# Patient Record
Sex: Female | Born: 1955 | Race: Black or African American | Hispanic: No | Marital: Married | Smoking: Never smoker
Health system: Southern US, Community
[De-identification: ages and names within clinical notes are randomized; demographics above are authoritative.]

## PROBLEM LIST (undated history)

## (undated) DIAGNOSIS — F419 Anxiety disorder, unspecified: Secondary | ICD-10-CM

## (undated) DIAGNOSIS — F329 Major depressive disorder, single episode, unspecified: Secondary | ICD-10-CM

## (undated) DIAGNOSIS — F0390 Unspecified dementia without behavioral disturbance: Secondary | ICD-10-CM

## (undated) DIAGNOSIS — F32A Depression, unspecified: Secondary | ICD-10-CM

## (undated) DIAGNOSIS — S42309A Unspecified fracture of shaft of humerus, unspecified arm, initial encounter for closed fracture: Secondary | ICD-10-CM

## (undated) HISTORY — DX: Anxiety disorder, unspecified: F41.9

## (undated) HISTORY — DX: Depression, unspecified: F32.A

## (undated) HISTORY — PX: OTHER SURGICAL HISTORY: SHX169

## (undated) HISTORY — DX: Unspecified dementia, unspecified severity, without behavioral disturbance, psychotic disturbance, mood disturbance, and anxiety: F03.90

## (undated) HISTORY — PX: BACK SURGERY: SHX140

## (undated) HISTORY — DX: Major depressive disorder, single episode, unspecified: F32.9

## (undated) HISTORY — PX: BREAST ENHANCEMENT SURGERY: SHX7

## (undated) HISTORY — DX: Unspecified fracture of shaft of humerus, unspecified arm, initial encounter for closed fracture: S42.309A

---

## 2012-12-20 ENCOUNTER — Encounter: Payer: Self-pay | Admitting: General Practice

## 2012-12-20 ENCOUNTER — Ambulatory Visit (INDEPENDENT_AMBULATORY_CARE_PROVIDER_SITE_OTHER): Payer: BC Managed Care – PPO | Admitting: General Practice

## 2012-12-20 VITALS — BP 131/78 | HR 57 | Temp 97.4°F | Wt 138.5 lb

## 2012-12-20 DIAGNOSIS — R21 Rash and other nonspecific skin eruption: Secondary | ICD-10-CM

## 2012-12-20 DIAGNOSIS — Z23 Encounter for immunization: Secondary | ICD-10-CM

## 2012-12-20 DIAGNOSIS — B360 Pityriasis versicolor: Secondary | ICD-10-CM

## 2012-12-20 LAB — POCT SKIN KOH: Skin KOH, POC: NEGATIVE

## 2012-12-20 MED ORDER — CLOTRIMAZOLE-BETAMETHASONE 1-0.05 % EX CREA: 1.0000 "application " | TOPICAL_CREAM | Freq: Two times a day (BID) | CUTANEOUS | Status: DC

## 2012-12-20 NOTE — Patient Instructions (Signed)
Tinea Versicolor  Tinea versicolor is a common yeast infection of the skin. This condition becomes known when the yeast on our skin starts to overgrow (yeast is a normal inhabitant on our skin). This condition is noticed as white or light brown patches on brown skin, and is more evident in the summer on tanned skin. These areas are slightly scaly if scratched. The light patches from the yeast become evident when the yeast creates "holes in your suntan". This is most often noticed in the summer. The patches are usually located on the chest, back, pubis, neck and body folds. However, it may occur on any area of body. Mild itching and inflammation (redness or soreness) may be present.  DIAGNOSIS   The diagnosisof this is made clinically (by looking). Cultures from samples are usually not needed. Examination under the microscope may help. However, yeast is normally found on skin. The diagnosis still remains clinical. Examination under Wood's Ultraviolet Light can determine the extent of the infection.  TREATMENT   This common infection is usually only of cosmetic (only a concern to your appearance). It is easily treated with dandruff shampoo used during showers or bathing. Vigorous scrubbing will eliminate the yeast over several days time. The light areas in your skin may remain for weeks or months after the infection is cured unless your skin is exposed to sunlight. The lighter or darker spots caused by the fungus that remain after complete treatment are not a sign of treatment failure; it will take a long time to resolve. Your caregiver may recommend a number of commercial preparations or medication by mouth if home care is not working. Recurrence is common and preventative medication may be necessary.  This skin condition is not highly contagious. Special care is not needed to protect close friends and family members. Normal hygiene is usually enough. Follow up is required only if you develop complications (such as a  secondary infection from scratching), if recommended by your caregiver, or if no relief is obtained from the preparations used.  Document Released: 01/03/2000 Document Revised: 03/30/2011 Document Reviewed: 02/15/2008  ExitCare Patient Information 2014 [employer].

## 2012-12-20 NOTE — Progress Notes (Signed)
   Subjective:    Patient ID: Jasmine [name], female    DOB: 23-Dec-1955, 57 y.o.   MRN: [id number]  Rash This is a new problem. The current episode started in the past 7 days. The problem is unchanged. The affected locations include the face. The rash is characterized by itchiness, dryness, redness and scaling. She was exposed to nothing. Pertinent negatives include no congestion, cough, facial edema, fatigue, rhinorrhea, shortness of breath or sore throat. Past treatments include anti-itch cream. The treatment provided no relief. There is no history of allergies, asthma or eczema.      Review of Systems  Constitutional: Negative for fatigue.  HENT: Negative for congestion, rhinorrhea and sore throat.   Respiratory: Negative for cough, chest tightness and shortness of breath.   Cardiovascular: Negative for chest pain and palpitations.  Skin: Positive for rash.       Red itchy rash to left upper lip and lower right chin area  Neurological: Negative for dizziness, weakness and headaches.       Objective:   Physical Exam  Constitutional: She is oriented to person, place, and time. She appears well-developed and well-nourished.  Pulmonary/Chest: Effort normal and breath sounds normal. No respiratory distress. She exhibits no tenderness.  Neurological: She is alert and oriented to person, place, and time.  Skin: Skin is warm and dry. Rash noted.  Maculopapular erythematous, scaly round rash noted to left upper lip and right chin area. Negative drainage.  Psychiatric: She has a normal mood and affect.     Results for orders placed in visit on 12/20/12  POCT SKIN KOH      Result Value Range   Skin KOH, POC Negative          Assessment & Plan:  1. Rash and nonspecific skin eruption  - POCT Skin KOH  2. Tinea versicolor  - clotrimazole-betamethasone (LOTRISONE) cream; Apply 1 application topically 2 (two) times daily.  Dispense: 30 g; Refill: 0 -keep skin clean and dry -refrain  from wearing make up until resolved -will refer to dermatology if symptoms worsen or unresolved -Patient verbalized understanding Coralie Keens, FNP-C

## 2013-01-02 ENCOUNTER — Encounter: Payer: Self-pay | Admitting: General Practice

## 2013-01-02 ENCOUNTER — Ambulatory Visit (INDEPENDENT_AMBULATORY_CARE_PROVIDER_SITE_OTHER): Payer: BC Managed Care – PPO | Admitting: General Practice

## 2013-01-02 VITALS — BP 118/69 | HR 79 | Temp 98.3°F | Ht 64.0 in | Wt 132.0 lb

## 2013-01-02 DIAGNOSIS — Z1239 Encounter for other screening for malignant neoplasm of breast: Secondary | ICD-10-CM

## 2013-01-02 DIAGNOSIS — Z01419 Encounter for gynecological examination (general) (routine) without abnormal findings: Secondary | ICD-10-CM

## 2013-01-02 DIAGNOSIS — Z0000 Encounter for general adult medical examination without abnormal findings: Secondary | ICD-10-CM

## 2013-01-02 DIAGNOSIS — B360 Pityriasis versicolor: Secondary | ICD-10-CM

## 2013-01-02 DIAGNOSIS — Z124 Encounter for screening for malignant neoplasm of cervix: Secondary | ICD-10-CM

## 2013-01-02 LAB — POCT CBC
Granulocyte percent: 68.9 %G (ref 37–80)
HCT, POC: 41.3 % (ref 37.7–47.9)
Hemoglobin: 13.4 g/dL (ref 12.2–16.2)
Lymph, poc: 1.5 (ref 0.6–3.4)
MCH, POC: 29.7 pg (ref 27–31.2)
MCHC: 32.5 g/dL (ref 31.8–35.4)
MCV: 91.4 fL (ref 80–97)
MPV: 8.6 fL (ref 0–99.8)
POC Granulocyte: 3.9 (ref 2–6.9)
POC LYMPH PERCENT: 25.9 %L (ref 10–50)
Platelet Count, POC: 215 10*3/uL (ref 142–424)
RBC: 4.5 M/uL (ref 4.04–5.48)
RDW, POC: 13.7 %
WBC: 5.7 10*3/uL (ref 4.6–10.2)

## 2013-01-02 NOTE — Patient Instructions (Signed)
Pap Test A Pap test checks the cells on the surface of your cervix. Your doctor will look for cell changes that are not normal, an infection, or cancer. If the cells no longer look normal, it is called dysplasia. Dysplasia can turn into cancer. Regular Pap tests are important to stop cancer from developing. BEFORE THE PROCEDURE  Ask your doctor when to schedule your Pap test. Timing the test around your period may be important.  Do not douche or have sex (intercourse) for 24 hours before the test.  Do not put creams on your vagina or use tampons for 24 hours before the test.  Go pee (urinate) just before the test. PROCEDURE  You will lie on an exam table with your feet in stirrups.  A warm metal or plastic tool (speculum) will be put in your vagina to open it up.  Your doctor will use a small, plastic brush or wooden spatula to take cells from your cervix.  The cells will be put in a lab container.  The cells will be checked under a microscope to see if they are normal or not. AFTER THE PROCEDURE Get your test results. If they are abnormal, you may need more tests. Document Released: 02/07/2010 Document Revised: 03/30/2011 Document Reviewed: 01/01/2011 Exit[employer] Patient Information 2014 [employer].  

## 2013-01-02 NOTE — Progress Notes (Signed)
   Subjective:    Patient ID: Jasmine [name], female    DOB: May 26, 1955, 57 y.o.   MRN: [id number]  HPI Patient presents today for annual exam. Reports eating a healthy diet and denies regular exercise.  Reports relocating from [place] to [place], [place] 4 months ago. Reports a injury 14 years ago that resulted in tailbone fracture and chronic pain from injury. She reports discontinuing OxyContin on her own, due to noticed memory loss. Denies any other chronic conditions.     Review of Systems  Constitutional: Negative for fever and chills.  Respiratory: Negative for chest tightness and shortness of breath.   Cardiovascular: Negative for chest pain and palpitations.  Gastrointestinal: Negative for nausea, vomiting, abdominal pain, diarrhea, constipation and blood in stool.  Genitourinary: Negative for dysuria and difficulty urinating.  Musculoskeletal: Positive for back pain.       Back pain from injury 14 years ago  Neurological: Negative for dizziness, weakness and headaches.  All other systems reviewed and are negative.        Objective:   Physical Exam  Constitutional: She is oriented to person, place, and time. She appears well-developed and well-nourished.  HENT:  Head: Normocephalic and atraumatic.  Right Ear: External ear normal.  Left Ear: External ear normal.  Mouth/Throat: Oropharynx is clear and moist.  Eyes: Conjunctivae and EOM are normal. Pupils are equal, round, and reactive to light.  Neck: Normal range of motion. Neck supple. No thyromegaly present.  Cardiovascular: Normal rate, regular rhythm, normal heart sounds and intact distal pulses.   Pulmonary/Chest: Effort normal and breath sounds normal. No respiratory distress. Right breast exhibits no inverted nipple, no nipple discharge, no skin change and no tenderness. Left breast exhibits no inverted nipple, no nipple discharge, no skin change and no tenderness. Breasts are asymmetrical.  Patient has breast implants and  breast are asymmetrical.   Abdominal: Soft. Bowel sounds are normal. She exhibits no distension.  Genitourinary: Vagina normal and uterus normal. No breast swelling, tenderness, discharge or bleeding. No labial fusion. There is no rash, tenderness, lesion or injury on the right labia. There is no rash, tenderness, lesion or injury on the left labia. Uterus is not deviated, not enlarged, not fixed and not tender. Cervix exhibits no motion tenderness, no discharge and no friability. Right adnexum displays no mass, no tenderness and no fullness. Left adnexum displays no mass, no tenderness and no fullness. No erythema, tenderness or bleeding around the vagina. No foreign body around the vagina. No signs of injury around the vagina. No vaginal discharge found.  Lymphadenopathy:    She has no cervical adenopathy.  Neurological: She is alert and oriented to person, place, and time.  Skin: Skin is warm and dry.  Psychiatric: She has a normal mood and affect.          Assessment & Plan:  1. Annual physical exam  - POCT CBC - CMP14+EGFR - NMR, lipoprofile - Pap IG w/ reflex to HPV when ASC-U  2. Breast cancer screening  - MM Digital Diagnostic Bilat; Future (appointment scheduled for Jan. 07, 2015 at 11am, [facility]) -discussed healthy eating and regular exercise -RTO prn Patient verbalized understanding Coralie Keens, FNP-C

## 2013-01-03 MED ORDER — CLOTRIMAZOLE-BETAMETHASONE 1-0.05 % EX CREA: 1.0000 "application " | TOPICAL_CREAM | Freq: Two times a day (BID) | CUTANEOUS | Status: AC

## 2013-01-04 LAB — NMR, LIPOPROFILE
Cholesterol: 195 mg/dL (ref <200)
HDL Cholesterol by NMR: 77 mg/dL (ref >=40)
HDL Particle Number: 35.9 umol/L (ref >=30.5)
LDL Particle Number: 1118 nmol/L — ABNORMAL HIGH (ref <1000)
LDL Size: 21.7 nm (ref >20.5)
LDLC SERPL CALC-MCNC: 103 mg/dL — ABNORMAL HIGH (ref <100)
LP-IR Score: <25 (ref <=45)
Small LDL Particle Number: <90 nmol/L (ref <=527)
Triglycerides by NMR: 76 mg/dL (ref <150)

## 2013-01-04 LAB — CMP14+EGFR
ALT: 11 IU/L (ref 0–32)
AST: 30 IU/L (ref 0–40)
Albumin/Globulin Ratio: 2.1 (ref 1.1–2.5)
Albumin: 5 g/dL (ref 3.5–5.5)
Alkaline Phosphatase: 73 IU/L (ref 39–117)
BUN/Creatinine Ratio: 6 — ABNORMAL LOW (ref 9–23)
BUN: 5 mg/dL — ABNORMAL LOW (ref 6–24)
CO2: 24 mmol/L (ref 18–29)
Calcium: 9.3 mg/dL (ref 8.7–10.2)
Chloride: 103 mmol/L (ref 97–108)
Creatinine, Ser: 0.82 mg/dL (ref 0.57–1.00)
GFR calc Af Amer: 92 mL/min/{1.73_m2} (ref >59)
GFR calc non Af Amer: 80 mL/min/{1.73_m2} (ref >59)
Globulin, Total: 2.4 g/dL (ref 1.5–4.5)
Glucose: 84 mg/dL (ref 65–99)
Potassium: 4.5 mmol/L (ref 3.5–5.2)
Sodium: 146 mmol/L — ABNORMAL HIGH (ref 134–144)
Total Bilirubin: 0.6 mg/dL (ref 0.0–1.2)
Total Protein: 7.4 g/dL (ref 6.0–8.5)

## 2013-01-06 LAB — PAP IG W/ RFLX HPV ASCU: PAP Smear Comment: 0

## 2013-01-25 ENCOUNTER — Ambulatory Visit (HOSPITAL_COMMUNITY)
Admission: RE | Admit: 2013-01-25 | Discharge: 2013-01-25 | Disposition: A | Discharge status: Home / Self Care | Payer: BC Managed Care – PPO | Source: Ambulatory Visit | Attending: General Practice | Admitting: General Practice

## 2013-01-25 DIAGNOSIS — Z1239 Encounter for other screening for malignant neoplasm of breast: Secondary | ICD-10-CM

## 2013-01-25 DIAGNOSIS — N6489 Other specified disorders of breast: Secondary | ICD-10-CM | POA: Insufficient documentation

## 2013-01-25 DIAGNOSIS — Z978 Presence of other specified devices: Secondary | ICD-10-CM | POA: Insufficient documentation

## 2013-01-25 DIAGNOSIS — Z9886 Personal history of breast implant removal: Secondary | ICD-10-CM | POA: Insufficient documentation

## 2013-05-03 ENCOUNTER — Telehealth: Payer: Self-pay | Admitting: Family Medicine

## 2013-05-04 NOTE — Telephone Encounter (Signed)
Patient should be able to get an appointment on her own with the surgeon that put the implants in. If still needs the referral then she needs to let us know who it is she needs the referral.

## 2013-05-04 NOTE — Telephone Encounter (Signed)
Patient had them placed in [place]. Explained that we don't refer for implants so I'm not sure who specializes in that area of plastic surgery. She may be able to look for reviews online. Suggested someone in [place] or [place] area. She will call back if she has any problems.

## 2013-05-23 ENCOUNTER — Ambulatory Visit: Payer: BC Managed Care – PPO | Admitting: Nurse Practitioner

## 2013-06-14 ENCOUNTER — Ambulatory Visit (INDEPENDENT_AMBULATORY_CARE_PROVIDER_SITE_OTHER): Payer: BC Managed Care – PPO | Admitting: Family

## 2013-06-14 ENCOUNTER — Encounter: Payer: Self-pay | Admitting: Family

## 2013-06-14 VITALS — BP 114/70 | HR 71 | Temp 98.9°F | Ht 64.0 in | Wt 131.2 lb

## 2013-06-14 DIAGNOSIS — F411 Generalized anxiety disorder: Secondary | ICD-10-CM

## 2013-06-14 MED ORDER — ESCITALOPRAM OXALATE 10 MG PO TABS: 10.0000 mg | ORAL_TABLET | Freq: Every day | ORAL | Status: DC

## 2013-06-14 NOTE — Patient Instructions (Addendum)
Stress Management Stress is a state of physical or mental tension that often results from changes in your life or normal routine. Some common causes of stress are:  Death of a loved one.  Injuries or severe illnesses.  Getting fired or changing jobs.  Moving into a new home. Other causes may be:  Sexual problems.  Business or financial losses.  Taking on a large debt.  Regular conflict with someone at home or at work.  Constant tiredness from lack of sleep. It is not just bad things that are stressful. It may be stressful to:  Win the lottery.  Get married.  Buy a new car. The amount of stress that can be easily tolerated varies from person to person. Changes generally cause stress, regardless of the types of change. Too much stress can affect your health. It may lead to physical or emotional problems. Too little stress (boredom) may also become stressful. SUGGESTIONS TO REDUCE STRESS:  Talk things over with your family and friends. It often is helpful to share your concerns and worries. If you feel your problem is serious, you may want to get help from a professional counselor.  Consider your problems one at a time instead of lumping them all together. Trying to take care of everything at once may seem impossible. List all the things you need to do and then start with the most important one. Set a goal to accomplish 2 or 3 things each day. If you expect to do too many in a single day you will naturally fail, causing you to feel even more stressed.  Do not use alcohol or drugs to relieve stress. Although you may feel better for a short time, they do not remove the problems that caused the stress. They can also be habit forming.  Exercise regularly - at least 3 times per week. Physical exercise can help to relieve that "uptight" feeling and will relax you.  The shortest distance between despair and hope is often a good night's sleep.  Go to bed and get up on time allowing  yourself time for appointments without being rushed.  Take a short "time-out" period from any stressful situation that occurs during the day. Close your eyes and take some deep breaths. Starting with the muscles in your face, tense them, hold it for a few seconds, then relax. Repeat this with the muscles in your neck, shoulders, hand, stomach, back and legs.  Take good care of yourself. Eat a balanced diet and get plenty of rest.  Schedule time for having fun. Take a break from your daily routine to relax. HOME CARE INSTRUCTIONS   Call if you feel overwhelmed by your problems and feel you can no longer manage them on your own.  Return immediately if you feel like hurting yourself or someone else. Document Released: 07/01/2000 Document Revised: 03/30/2011 Document Reviewed: 08/30/2012 Exit[employer] Patient Information 2014 [employer].  

## 2013-06-14 NOTE — Progress Notes (Signed)
   Subjective:    Patient ID: Jasmine [name], female    DOB: May 23, 1955, 58 y.o.   MRN: [id number]  Anxiety Presents for initial visit. Onset was in the past 7 days. The problem has been waxing and waning. Symptoms include confusion, depressed mood, excessive worry, hyperventilation, insomnia, irritability, nervous/anxious behavior, palpitations and panic. Patient reports no suicidal ideas. Symptoms occur constantly. The severity of symptoms is interfering with daily activities. The symptoms are aggravated by family issues and social activities. The quality of sleep is poor.   Risk factors include a major life event (Son was just placed in jail last week). Her past medical history is significant for anxiety/panic attacks and depression. There is no history of chronic lung disease or fibromyalgia. Past treatments include lifestyle changes.      Review of Systems  Constitutional: Positive for irritability.  HENT: Negative.   Respiratory: Negative.   Cardiovascular: Positive for palpitations.  Genitourinary: Negative.   Musculoskeletal: Negative.   Neurological: Negative.   Hematological: Negative.   Psychiatric/Behavioral: Positive for confusion. Negative for suicidal ideas. The patient is nervous/anxious and has insomnia.   All other systems reviewed and are negative.      Objective:   Physical Exam  Vitals reviewed. Constitutional: She is oriented to person, place, and time. She appears well-developed and well-nourished.  Cardiovascular: Normal rate, regular rhythm, normal heart sounds and intact distal pulses.   No murmur heard. Pulmonary/Chest: Effort normal and breath sounds normal. No respiratory distress. She has no wheezes.  Abdominal: Soft. Bowel sounds are normal. She exhibits no distension. There is no tenderness.  Musculoskeletal: Normal range of motion.  Neurological: She is alert and oriented to person, place, and time.  Skin: Skin is warm and dry.  Psychiatric:  Judgment and thought content normal. Her mood appears anxious. She is withdrawn.  Pt very tearful and emotional      BP 114/70  Pulse 71  Temp(Src) 98.9 F (37.2 C) (Oral)  Ht 5\' 4"  (1.626 m)  Wt 131 lb 3.2 oz (59.512 kg)  BMI 22.51 kg/m2     Assessment & Plan:  1. Generalized anxiety disorder -Stress management -List of psychiatrists given to pt to schedule appointment to talk with someone -RTO in 1 week to increase medication if needed - escitalopram (LEXAPRO) 10 MG tablet; Take 1 tablet (10 mg total) by mouth daily.  Dispense: 30 tablet; Refill: [place], FNP

## 2013-06-21 ENCOUNTER — Ambulatory Visit: Payer: BC Managed Care – PPO | Admitting: Family

## 2013-06-22 ENCOUNTER — Encounter: Payer: Self-pay | Admitting: Family

## 2013-06-22 ENCOUNTER — Ambulatory Visit (INDEPENDENT_AMBULATORY_CARE_PROVIDER_SITE_OTHER): Payer: BC Managed Care – PPO | Admitting: Family

## 2013-06-22 VITALS — BP 129/82 | HR 61 | Temp 98.4°F | Ht 64.0 in | Wt 129.0 lb

## 2013-06-22 DIAGNOSIS — F411 Generalized anxiety disorder: Secondary | ICD-10-CM

## 2013-06-22 MED ORDER — BUSPIRONE HCL 7.5 MG PO TABS: 7.5000 mg | ORAL_TABLET | Freq: Two times a day (BID) | ORAL | Status: DC

## 2013-06-22 MED ORDER — ALPRAZOLAM 0.25 MG PO TABS: 0.2500 mg | ORAL_TABLET | Freq: Two times a day (BID) | ORAL | Status: DC | PRN

## 2013-06-22 MED ORDER — BUSPIRONE HCL 7.5 MG PO TABS: 7.5000 mg | ORAL_TABLET | Freq: Three times a day (TID) | ORAL | Status: DC

## 2013-06-22 NOTE — Progress Notes (Signed)
   Subjective:    Patient ID: Jasmine [name], female    DOB: 1955-01-23, 58 y.o.   MRN: [id number]  HPI Pt presents to office for follow-up for anxiety. Pt was given RX of lexapro 10 mg PO. After starting medication, pt broke out into rash on arms and face three days later. Pt stopped medication and the rash "started drying up". Pt still anxious and is stressed. Pt has a son that was recently placed in jail. This causes the pt a lot of emotional stress and worry. Denies any suicidal thoughts.    Review of Systems  Constitutional: Negative.   HENT: Negative.   Eyes: Negative.   Respiratory: Negative.   Cardiovascular: Negative.   Gastrointestinal: Negative.   Genitourinary: Negative.   Musculoskeletal: Negative.   Psychiatric/Behavioral: Positive for decreased concentration. Negative for suicidal ideas. The patient is nervous/anxious.   All other systems reviewed and are negative.      Objective:   Physical Exam  Vitals reviewed. Constitutional: She is oriented to person, place, and time. She appears well-developed and well-nourished. No distress.  HENT:  Head: Normocephalic and atraumatic.  Right Ear: External ear normal.  Left Ear: External ear normal.  Mouth/Throat: Oropharynx is clear and moist.  Eyes: Pupils are equal, round, and reactive to light.  Neck: Normal range of motion. Neck supple. No thyromegaly present.  Cardiovascular: Normal rate, regular rhythm, normal heart sounds and intact distal pulses.   No murmur heard. Pulmonary/Chest: Effort normal and breath sounds normal. No respiratory distress. She has no wheezes.  Abdominal: Soft. Bowel sounds are normal. She exhibits no distension. There is no tenderness.  Musculoskeletal: Normal range of motion. She exhibits no edema and no tenderness.  Neurological: She is alert and oriented to person, place, and time. She has normal reflexes. No cranial nerve deficit.  Skin: Skin is warm and dry.  Psychiatric: Judgment and  thought content normal. Her mood appears anxious. She is withdrawn. She exhibits a depressed mood.    BP 129/82  Pulse 61  Temp(Src) 98.4 F (36.9 C) (Oral)  Ht 5\' 4"  (1.626 m)  Wt 129 lb (58.514 kg)  BMI 22.13 kg/m2       Assessment & Plan:  1. GAD (generalized anxiety disorder) -Stress management -Make an appointment with behavioral health- List of clinics given to pt to call -RTO 1 week - busPIRone (BUSPAR) 7.5 MG tablet; Take 1 tablet (7.5 mg total) by mouth 3 (three) times daily.  Dispense: 30 tablet; Refill: 1 - ALPRAZolam (XANAX) 0.25 MG tablet; Take 1 tablet (0.25 mg total) by mouth 2 (two) times daily as needed for anxiety.  Dispense: 30 tablet; Refill: 0  Evelina Dun, FNP

## 2013-06-22 NOTE — Patient Instructions (Signed)
Stress Management Stress is a state of physical or mental tension that often results from changes in your life or normal routine. Some common causes of stress are:  Death of a loved one.  Injuries or severe illnesses.  Getting fired or changing jobs.  Moving into a new home. Other causes may be:  Sexual problems.  Business or financial losses.  Taking on a large debt.  Regular conflict with someone at home or at work.  Constant tiredness from lack of sleep. It is not just bad things that are stressful. It may be stressful to:  Win the lottery.  Get married.  Buy a new car. The amount of stress that can be easily tolerated varies from person to person. Changes generally cause stress, regardless of the types of change. Too much stress can affect your health. It may lead to physical or emotional problems. Too little stress (boredom) may also become stressful. SUGGESTIONS TO REDUCE STRESS:  Talk things over with your family and friends. It often is helpful to share your concerns and worries. If you feel your problem is serious, you may want to get help from a professional counselor.  Consider your problems one at a time instead of lumping them all together. Trying to take care of everything at once may seem impossible. List all the things you need to do and then start with the most important one. Set a goal to accomplish 2 or 3 things each day. If you expect to do too many in a single day you will naturally fail, causing you to feel even more stressed.  Do not use alcohol or drugs to relieve stress. Although you may feel better for a short time, they do not remove the problems that caused the stress. They can also be habit forming.  Exercise regularly - at least 3 times per week. Physical exercise can help to relieve that "uptight" feeling and will relax you.  The shortest distance between despair and hope is often a good night's sleep.  Go to bed and get up on time allowing  yourself time for appointments without being rushed.  Take a short "time-out" period from any stressful situation that occurs during the day. Close your eyes and take some deep breaths. Starting with the muscles in your face, tense them, hold it for a few seconds, then relax. Repeat this with the muscles in your neck, shoulders, hand, stomach, back and legs.  Take good care of yourself. Eat a balanced diet and get plenty of rest.  Schedule time for having fun. Take a break from your daily routine to relax. HOME CARE INSTRUCTIONS   Call if you feel overwhelmed by your problems and feel you can no longer manage them on your own.  Return immediately if you feel like hurting yourself or someone else. Document Released: 07/01/2000 Document Revised: 03/30/2011 Document Reviewed: 08/30/2012 Exit[employer] Patient Information 2014 [employer].  

## 2013-07-31 ENCOUNTER — Encounter: Payer: Self-pay | Admitting: Family

## 2013-07-31 ENCOUNTER — Ambulatory Visit (INDEPENDENT_AMBULATORY_CARE_PROVIDER_SITE_OTHER): Payer: BC Managed Care – PPO | Admitting: Family

## 2013-07-31 VITALS — BP 119/71 | HR 87 | Temp 99.3°F | Ht 64.0 in | Wt 132.2 lb

## 2013-07-31 DIAGNOSIS — L259 Unspecified contact dermatitis, unspecified cause: Secondary | ICD-10-CM

## 2013-07-31 MED ORDER — METHYLPREDNISOLONE (PAK) 4 MG PO TABS: ORAL_TABLET | ORAL | Status: DC

## 2013-07-31 MED ORDER — METHYLPREDNISOLONE ACETATE 80 MG/ML IJ SUSP
80.0000 mg | Freq: Once | INTRAMUSCULAR | Status: AC
  Administered 2013-07-31: 80 mg via INTRAMUSCULAR

## 2013-07-31 MED ORDER — HYDROCORTISONE 2.5 % EX CREA: TOPICAL_CREAM | Freq: Two times a day (BID) | CUTANEOUS | Status: DC

## 2013-07-31 MED ORDER — TRIAMCINOLONE ACETONIDE 0.5 % EX OINT: 1.0000 "application " | TOPICAL_OINTMENT | Freq: Two times a day (BID) | CUTANEOUS | Status: DC

## 2013-07-31 NOTE — Progress Notes (Signed)
   Subjective:    Patient ID: Jasmine [name], female    DOB: [birth year], 58 y.o.   MRN: [id number]  Rash This is a recurrent problem. The current episode started more than 1 month ago. The problem has been gradually worsening since onset. The affected locations include the face, lips, left arm, left elbow, right arm, right elbow, abdomen and neck. The rash is characterized by itchiness and redness. She was exposed to nothing. Pertinent negatives include no fatigue, fever, joint pain, shortness of breath or vomiting. Past treatments include anti-itch cream. The treatment provided mild relief. There is no history of allergies.      Review of Systems  Constitutional: Negative.  Negative for fever and fatigue.  HENT: Negative.   Eyes: Negative.   Respiratory: Negative.  Negative for shortness of breath.   Cardiovascular: Negative.  Negative for palpitations.  Gastrointestinal: Negative.  Negative for vomiting.  Endocrine: Negative.   Genitourinary: Negative.   Musculoskeletal: Negative.  Negative for joint pain.  Skin: Positive for rash.  Neurological: Negative.  Negative for headaches.  Hematological: Negative.   Psychiatric/Behavioral: Negative.   All other systems reviewed and are negative.      Objective:   Physical Exam  Vitals reviewed. Constitutional: She is oriented to person, place, and time. She appears well-developed and well-nourished. No distress.  HENT:  Head: Normocephalic and atraumatic.  Right Ear: External ear normal.  Mouth/Throat: Oropharynx is clear and moist.  Eyes: Pupils are equal, round, and reactive to light.  Neck: Normal range of motion. Neck supple. No thyromegaly present.  Cardiovascular: Normal rate, regular rhythm, normal heart sounds and intact distal pulses.   No murmur heard. Pulmonary/Chest: Effort normal and breath sounds normal. No respiratory distress. She has no wheezes.  Abdominal: Soft. Bowel sounds are normal. She exhibits no distension.  There is no tenderness.  Musculoskeletal: Normal range of motion. She exhibits no edema and no tenderness.  Neurological: She is alert and oriented to person, place, and time.  Skin: Skin is warm and dry. Rash noted. There is erythema.  Generalized erythemas papules on bilateral arms, chest, neck, abdomen, and lips  Psychiatric: She has a normal mood and affect. Her behavior is normal. Judgment and thought content normal.    BP 119/71  Pulse 87  Temp(Src) 99.3 F (37.4 C) (Oral)  Ht 5\' 4"  (1.626 m)  Wt 132 lb 3.2 oz (59.966 kg)  BMI 22.68 kg/m2       Assessment & Plan:  1. Contact dermatitis -Do not scratch areas -Discussed good hand hygiene -Can take benadryl as needed -Pt presents to office for dizziness, and lightheaded - methylPREDNIsolone (MEDROL DOSPACK) 4 MG tablet; follow package directions  Dispense: 21 tablet; Refill: 0 - hydrocortisone 2.5 % cream; Apply topically 2 (two) times daily.  Dispense: 30 g; Refill: 0 - triamcinolone ointment (KENALOG) 0.5 %; Apply 1 application topically 2 (two) times daily.  Dispense: 30 g; Refill: 0   Evelina Dun, FNP

## 2013-07-31 NOTE — Addendum Note (Signed)
Addended by: Shelbie Ammons on: 07/31/2013 03:39 PM   Modules accepted: Orders

## 2013-07-31 NOTE — Patient Instructions (Signed)

## 2013-08-31 ENCOUNTER — Ambulatory Visit (INDEPENDENT_AMBULATORY_CARE_PROVIDER_SITE_OTHER): Payer: BC Managed Care – PPO | Admitting: Family Medicine

## 2013-08-31 ENCOUNTER — Encounter: Payer: Self-pay | Admitting: Family Medicine

## 2013-08-31 VITALS — BP 99/67 | HR 84 | Temp 98.4°F | Ht 64.0 in | Wt 129.0 lb

## 2013-08-31 DIAGNOSIS — L259 Unspecified contact dermatitis, unspecified cause: Secondary | ICD-10-CM

## 2013-08-31 DIAGNOSIS — L309 Dermatitis, unspecified: Secondary | ICD-10-CM

## 2013-08-31 MED ORDER — DOXYCYCLINE HYCLATE 100 MG PO TABS: 100.0000 mg | ORAL_TABLET | Freq: Two times a day (BID) | ORAL | Status: DC

## 2013-08-31 MED ORDER — HYDROXYZINE HCL 25 MG PO TABS: 25.0000 mg | ORAL_TABLET | Freq: Three times a day (TID) | ORAL | Status: DC | PRN

## 2013-08-31 NOTE — Progress Notes (Signed)
   Subjective:    Patient ID: Joaquin Music, female    DOB: [birth year], 58 y.o.   MRN: [id number]  HPI  C/o persistent rash on arms and neck that did respond somewhat to steroids but has returned.  She cannot quit itching.  Review of Systems    No chest pain, SOB, HA, dizziness, vision change, N/V, diarrhea, constipation, dysuria, urinary urgency or frequency, myalgias, arthralgias or rash.  Objective:   Physical Exam  Vital signs noted  Well developed well nourished female.  HEENT - Head atraumatic Normocephalic Respiratory - Lungs CTA bilateral Cardiac - RRR S1 and S2 without murmur Skin - Arms with rash with various stages of healing.  Rash on chest with excoriated areas.      Assessment & Plan:  Dermatitis - Plan: doxycycline (VIBRA-TABS) 100 MG tablet, hydrOXYzine (ATARAX/VISTARIL) 25 MG tablet, Ambulatory referral to Dermatology  Lysbeth Penner FNP

## 2013-10-12 ENCOUNTER — Ambulatory Visit: Payer: BC Managed Care – PPO

## 2013-10-18 ENCOUNTER — Ambulatory Visit: Payer: BC Managed Care – PPO

## 2013-10-18 ENCOUNTER — Ambulatory Visit (INDEPENDENT_AMBULATORY_CARE_PROVIDER_SITE_OTHER): Payer: BC Managed Care – PPO | Admitting: *Deleted

## 2013-10-18 DIAGNOSIS — Z23 Encounter for immunization: Secondary | ICD-10-CM

## 2013-10-18 NOTE — Patient Instructions (Signed)
Shingles Vaccine What You Need to Know WHAT IS SHINGLES?  Shingles is a painful skin rash, often with blisters. It is also called Herpes Zoster or just Zoster.  A shingles rash usually appears on one side of the face or body and lasts from 2 to 4 weeks. Its main symptom is pain, which can be quite severe. Other symptoms of shingles can include fever, headache, chills, and upset stomach. Very rarely, a shingles infection can lead to pneumonia, hearing problems, blindness, brain inflammation (encephalitis), or death.  For about 1 person in 5, severe pain can continue even after the rash clears up. This is called post-herpetic neuralgia.  Shingles is caused by the Varicella Zoster virus. This is the same virus that causes chickenpox. Only someone who has had a case of chickenpox or rarely, has gotten chickenpox vaccine, can get shingles. The virus stays in your body. It can reappear many years later to cause a case of shingles.  You cannot catch shingles from another person with shingles. However, a person who has never had chickenpox (or chickenpox vaccine) could get chickenpox from someone with shingles. This is not very common.  Shingles is far more common in people 50 and older than in younger people. It is also more common in people whose immune systems are weakened because of a disease such as cancer or drugs such as steroids or chemotherapy.  At least 1 million people get shingles per year in the [place]. SHINGLES VACCINE  A vaccine for shingles was licensed in 2006. In clinical trials, the vaccine reduced the risk of shingles by 50%. It can also reduce the pain in people who still get shingles after being vaccinated.  A single dose of shingles vaccine is recommended for adults 60 years of age and older. SOME PEOPLE SHOULD NOT GET SHINGLES VACCINE OR SHOULD WAIT A person should not get shingles vaccine if he or she:  Has ever had a life-threatening allergic reaction to gelatin, the  antibiotic neomycin, or any other component of shingles vaccine. Tell your caregiver if you have any severe allergies.  Has a weakened immune system because of current:  AIDS or another disease that affects the immune system.  Treatment with drugs that affect the immune system, such as prolonged use of high-dose steroids.  Cancer treatment, such as radiation or chemotherapy.  Cancer affecting the bone marrow or lymphatic system, such as leukemia or lymphoma.  Is pregnant, or might be pregnant. Women should not become pregnant until at least 4 weeks after getting shingles vaccine. Someone with a minor illness, such as a cold, may be vaccinated. Anyone with a moderate or severe acute illness should usually wait until he or she recovers before getting the vaccine. This includes anyone with a temperature of 101.3 F (38 C) or higher. WHAT ARE THE RISKS FROM SHINGLES VACCINE?  A vaccine, like any medicine, could possibly cause serious problems, such as severe allergic reactions. However, the risk of a vaccine causing serious harm, or death, is extremely small.  No serious problems have been identified with shingles vaccine. Mild Problems  Redness, soreness, swelling, or itching at the site of the injection (about 1 person in 3).  Headache (about 1 person in 70). Like all vaccines, shingles vaccine is being closely monitored for unusual or severe problems. WHAT IF THERE IS A MODERATE OR SEVERE REACTION? What should I look for? Any unusual condition, such as a severe allergic reaction or a high fever. If a severe allergic reaction   occurred, it would be within a few minutes to an hour after the shot. Signs of a serious allergic reaction can include difficulty breathing, weakness, hoarseness or wheezing, a fast heartbeat, hives, dizziness, paleness, or swelling of the throat. What should I do?  Call your caregiver, or get the person to a caregiver right away.  Tell the caregiver what  happened, the date and time it happened, and when the vaccination was given.  Ask the caregiver to report the reaction by filing a Vaccine Adverse Event Reporting System (VAERS) form. Or, you can file this report through the VAERS web site at www.vaers.hhs.gov or by calling 1-800-822-7967. VAERS does not provide medical advice. HOW CAN I LEARN MORE?  Ask your caregiver. He or she can give you the vaccine package insert or suggest other sources of information.  Contact the Centers for Disease Control and Prevention (CDC):  Call 1-800-232-4636 (1-800-CDC-INFO).  Visit the CDC website at www.cdc.gov/vaccines CDC Shingles Vaccine VIS (10/25/07) Document Released: 11/02/2005 Document Revised: 03/30/2011 Document Reviewed: 04/27/2012 ExitCare Patient Information 2015 [employer]. This information is not intended to replace advice given to you by your health care provider. Make sure you discuss any questions you have with your health care provider.  

## 2013-10-18 NOTE — Progress Notes (Signed)
Patient tolerated well.

## 2013-10-31 ENCOUNTER — Other Ambulatory Visit: Payer: Self-pay | Admitting: Physician Assistant

## 2013-12-01 ENCOUNTER — Other Ambulatory Visit: Payer: Self-pay | Admitting: Family Medicine

## 2013-12-06 ENCOUNTER — Encounter (INDEPENDENT_AMBULATORY_CARE_PROVIDER_SITE_OTHER): Payer: Self-pay

## 2013-12-06 ENCOUNTER — Ambulatory Visit (INDEPENDENT_AMBULATORY_CARE_PROVIDER_SITE_OTHER): Payer: BC Managed Care – PPO | Admitting: Nurse Practitioner

## 2013-12-06 ENCOUNTER — Encounter: Payer: Self-pay | Admitting: Nurse Practitioner

## 2013-12-06 VITALS — BP 122/75 | HR 87 | Temp 98.8°F | Ht 64.0 in | Wt 139.4 lb

## 2013-12-06 DIAGNOSIS — G8929 Other chronic pain: Secondary | ICD-10-CM | POA: Insufficient documentation

## 2013-12-06 DIAGNOSIS — M549 Dorsalgia, unspecified: Secondary | ICD-10-CM

## 2013-12-06 DIAGNOSIS — T8549XA Other mechanical complication of breast prosthesis and implant, initial encounter: Secondary | ICD-10-CM

## 2013-12-06 MED ORDER — MELOXICAM 15 MG PO TABS
15.0000 mg | ORAL_TABLET | Freq: Every day | ORAL | Status: DC
Start: 2013-12-06 — End: 2014-02-10

## 2013-12-06 NOTE — Patient Instructions (Signed)
Breast Screening Issues for Women With Breast Implants Regular breast screening is an important part of health care for women. Breast screening is usually done through mammography. Mammography is a noninvasive procedure in which an X-ray image (mammogram) is taken of your breasts. Abnormalities in your breast, such as tumors and cysts, can be found through mammography. Breast implants can complicate mammography. The implants may interfere with the result of the exam or evaluation of the exam result. ISSUES WITH MAMMOGRAPHY FOR WOMEN WITH IMPLANTS  The implant can hide or obscure breast tumors.   Scar tissue formed around the breast implant or the implant itself may prevent adequate compression of your breast during mammography.   Interpretation of the mammogram by the radiologist may be difficult because the implant can make it hard to see the underlying breast tissue.  The implant may rupture during mammography (uncommon).  Fear that the implant may rupture during mammography may discourage women from having the test.  ALTERNATIVE SCREENING TESTS FOR WOMEN WITH BREAST IMPLANTS Other tests that can be done for women with breast implants include:   MRI. An MRI allows your health care provider to see through your implants to the breast tissue and chest muscles. MRI is an excellent technique to detect implant leaks or ruptures.   Ultrasonography. Ultrasonography uses sound waves. It can also detect implant leaks and ruptures. An exam by your health care provider should be done yearly. Document Released: 12/19/2007 Document Revised: 01/10/2013 Document Reviewed: 07/26/2012 [facility] Patient Information 2015 [place], [place]. This information is not intended to replace advice given to you by your health care provider. Make sure you discuss any questions you have with your health care provider.

## 2013-12-06 NOTE — Progress Notes (Signed)
   Subjective:    Patient ID: Jasmine [name], female    DOB: Oct 23, 1955, 58 y.o.   MRN: [id number]  HPI Patient is here complaining of back pain that has been ongoing. She fell on her tailbone  About 15 years ago while trying to side down. She has been on oxycodone in the past * she is also c/o left breast pain- has had implant put in many years ago- has not had a mammogram.  Review of Systems  All other systems reviewed and are negative.      Objective:   Physical Exam  Constitutional: She is oriented to person, place, and time. She appears well-developed and well-nourished.  HENT:  Head: Normocephalic.  Eyes: Pupils are equal, round, and reactive to light.  Neck: Normal range of motion.  Cardiovascular: Normal rate.   Pulmonary/Chest: Effort normal and breath sounds normal.    Area noted has palpable hardened implant with bulging- left breast looks much larger then right- right implant not palpable.  Musculoskeletal: Normal range of motion.  Patient cannot sit directly on buttocks- has to sit leaning so as not to put (ressure on coccyx.  Neurological: She is alert and oriented to person, place, and time. She has normal reflexes.  Psychiatric: She has a normal mood and affect. Her behavior is normal. Judgment and thought content normal.     BP 122/75 mmHg  Pulse 87  Temp(Src) 98.8 F (37.1 C) (Oral)  Ht 5\' 4"  (1.626 m)  Wt 139 lb 6.4 oz (63.231 kg)  BMI 23.92 kg/m2      Assessment & Plan:  1. Chronic back pain Moist heat Back strengthening exercises If worsens will need to repeat MRI since it has been so long - meloxicam (MOBIC) 15 MG tablet; Take 1 tablet (15 mg total) by mouth daily.  Dispense: 30 tablet; Refill: 2  2. Breast implant protrusion, initial encounter Will let plastic surgeon decide about mammogram - Ambulatory referral to [place], [place]

## 2013-12-24 ENCOUNTER — Other Ambulatory Visit: Payer: Self-pay | Admitting: Family

## 2013-12-25 NOTE — Telephone Encounter (Signed)
Last ov 11/15

## 2013-12-28 ENCOUNTER — Other Ambulatory Visit: Payer: Self-pay | Admitting: Family Medicine

## 2014-01-29 ENCOUNTER — Telehealth: Payer: Self-pay | Admitting: Nurse Practitioner

## 2014-01-29 NOTE — Telephone Encounter (Signed)
Mother calling - Jasmine [name] having chest pain-  Call given to triage

## 2014-02-06 ENCOUNTER — Ambulatory Visit (INDEPENDENT_AMBULATORY_CARE_PROVIDER_SITE_OTHER): Payer: BLUE CROSS/BLUE SHIELD | Admitting: Family Medicine

## 2014-02-06 ENCOUNTER — Encounter: Payer: Self-pay | Admitting: Family Medicine

## 2014-02-06 VITALS — BP 125/79 | HR 114 | Temp 97.1°F | Ht 64.0 in | Wt 142.0 lb

## 2014-02-06 DIAGNOSIS — K625 Hemorrhage of anus and rectum: Secondary | ICD-10-CM

## 2014-02-06 MED ORDER — HYDROCORTISONE ACETATE 25 MG RE SUPP: 25.0000 mg | Freq: Two times a day (BID) | RECTAL | Status: DC

## 2014-02-06 NOTE — Progress Notes (Signed)
   Subjective:    Patient ID: Jasmine [name], female    DOB: 12/28/1955, 58 y.o.   MRN: [id number]  HPI Patient c/o BRBPR yesterday and today.  She denies pain or constipation.  She has not had colonoscopy in several years and is due for one.  Review of Systems  Constitutional: Negative for fever.  HENT: Negative for ear pain.   Eyes: Negative for discharge.  Respiratory: Negative for cough.   Cardiovascular: Negative for chest pain.  Gastrointestinal: Negative for abdominal distention.  Endocrine: Negative for polyuria.  Genitourinary: Negative for difficulty urinating.  Musculoskeletal: Negative for gait problem and neck pain.  Skin: Negative for color change and rash.  Neurological: Negative for speech difficulty and headaches.  Psychiatric/Behavioral: Negative for agitation.       Objective:    BP 125/79 mmHg  Pulse 114  Temp(Src) 97.1 F (36.2 C) (Oral)  Ht 5\' 4"  (1.626 m)  Wt 142 lb (64.411 kg)  BMI 24.36 kg/m2 Physical Exam  Constitutional: She is oriented to person, place, and time. She appears well-developed and well-nourished.  HENT:  Head: Normocephalic and atraumatic.  Mouth/Throat: Oropharynx is clear and moist.  Eyes: Pupils are equal, round, and reactive to light.  Neck: Normal range of motion. Neck supple.  Cardiovascular: Normal rate and regular rhythm.   No murmur heard. Pulmonary/Chest: Effort normal and breath sounds normal.  Abdominal: Soft. Bowel sounds are normal. There is no tenderness.  Neurological: She is alert and oriented to person, place, and time.  Skin: Skin is warm and dry.  Psychiatric: She has a normal mood and affect.  Rectum - No masses palpated with digital exam       Assessment & Plan:     ICD-9-CM ICD-10-CM   1. Rectal bleeding 569.3 K62.5 hydrocortisone (ANUSOL-HC) 25 MG suppository     Ambulatory referral to Gastroenterology     Return if symptoms worsen or fail to improve.  Lysbeth Penner FNP

## 2014-02-08 ENCOUNTER — Other Ambulatory Visit: Payer: Self-pay | Admitting: Nurse Practitioner

## 2014-02-10 ENCOUNTER — Other Ambulatory Visit: Payer: Self-pay | Admitting: Nurse Practitioner

## 2014-02-18 ENCOUNTER — Other Ambulatory Visit: Payer: Self-pay | Admitting: Family Medicine

## 2014-03-02 ENCOUNTER — Other Ambulatory Visit: Payer: Self-pay | Admitting: Specialist

## 2014-03-12 ENCOUNTER — Other Ambulatory Visit: Payer: Self-pay | Admitting: Nurse Practitioner

## 2014-04-20 ENCOUNTER — Other Ambulatory Visit (INDEPENDENT_AMBULATORY_CARE_PROVIDER_SITE_OTHER): Payer: BLUE CROSS/BLUE SHIELD

## 2014-04-20 DIAGNOSIS — D509 Iron deficiency anemia, unspecified: Secondary | ICD-10-CM | POA: Diagnosis not present

## 2014-04-20 NOTE — Progress Notes (Signed)
Lab only for dr. Stephanie Coup plastic surgery

## 2014-04-21 LAB — CBC, NO DIFFERENTIAL/PLATELET
HCT: 40.7 % (ref 34.0–46.6)
Hemoglobin: 13.5 g/dL (ref 11.1–15.9)
MCH: 30.3 pg (ref 26.6–33.0)
MCHC: 33.2 g/dL (ref 31.5–35.7)
MCV: 92 fL (ref 79–97)
RBC: 4.45 x10E6/uL (ref 3.77–5.28)
RDW: 14.6 % (ref 12.3–15.4)
WBC: 7.8 10*3/uL (ref 3.4–10.8)

## 2014-04-25 ENCOUNTER — Other Ambulatory Visit: Payer: Self-pay

## 2014-05-12 ENCOUNTER — Other Ambulatory Visit: Payer: Self-pay | Admitting: Family Medicine

## 2014-05-14 NOTE — Telephone Encounter (Signed)
Needs to have kideny function checked before refilling

## 2014-05-15 ENCOUNTER — Telehealth: Payer: Self-pay | Admitting: Family

## 2014-05-15 NOTE — Telephone Encounter (Signed)
TC to pt NTBS before refill of Mobic. Pt states that she is needing pain medication, had surgery on her implants two weeks ago. Instructed pt to call surgeon to refill this medication first, then if they are not able to she will need to have an appt here.

## 2014-05-15 NOTE — Telephone Encounter (Signed)
Patient advised that she needs to follow up with Dr. Stephanie Coup the one who performed the surgery if she is still need pain medication. Patient is requesting oxycodone.

## 2014-07-09 ENCOUNTER — Ambulatory Visit (INDEPENDENT_AMBULATORY_CARE_PROVIDER_SITE_OTHER): Payer: BLUE CROSS/BLUE SHIELD | Admitting: Nurse Practitioner

## 2014-07-09 ENCOUNTER — Encounter: Payer: Self-pay | Admitting: Nurse Practitioner

## 2014-07-09 VITALS — BP 130/80 | HR 79 | Temp 98.5°F | Ht 64.0 in | Wt 135.0 lb

## 2014-07-09 DIAGNOSIS — L309 Dermatitis, unspecified: Secondary | ICD-10-CM | POA: Diagnosis not present

## 2014-07-09 MED ORDER — PREDNISONE 20 MG PO TABS: ORAL_TABLET | ORAL | Status: DC

## 2014-07-09 MED ORDER — METHYLPREDNISOLONE ACETATE 80 MG/ML IJ SUSP
80.0000 mg | Freq: Once | INTRAMUSCULAR | Status: AC
  Administered 2014-07-09: 80 mg via INTRAMUSCULAR

## 2014-07-09 MED ORDER — HYDROXYZINE HCL 25 MG PO TABS: 25.0000 mg | ORAL_TABLET | Freq: Three times a day (TID) | ORAL | Status: DC | PRN

## 2014-07-09 NOTE — Progress Notes (Signed)
   Subjective:    Patient ID: Jasmine [name], female    DOB: Jun 09, 1955, 59 y.o.   MRN: [id number]  HPI Patient in c/o rash on face and arms that started about 1 week ago. Rash itches and is spreading. Denies exposure to plants, or new soaps or detergents. No new foods.    Review of Systems  Constitutional: Negative.   HENT: Negative.   Respiratory: Negative.   Cardiovascular: Negative.   Gastrointestinal: Negative.   Genitourinary: Negative.   Musculoskeletal: Negative.   Neurological: Negative.   Psychiatric/Behavioral: Negative.   All other systems reviewed and are negative.      Objective:   Physical Exam  Constitutional: She is oriented to person, place, and time. She appears well-developed and well-nourished.  Cardiovascular: Normal rate, regular rhythm and normal heart sounds.   Pulmonary/Chest: Effort normal and breath sounds normal.  Neurological: She is alert and oriented to person, place, and time.  Skin: Skin is warm.  Erythematous maculo papular lesions on face (periorbital nad around lips), arms and back  Psychiatric: She has a normal mood and affect. Her behavior is normal. Judgment and thought content normal.    BP 130/80 mmHg  Pulse 79  Temp(Src) 98.5 F (36.9 C) (Oral)  Ht 5\' 4"  (1.626 m)  Wt 135 lb (61.236 kg)  BMI 23.16 kg/m2       Assessment & Plan:   1. Dermatitis    Possible allergic in nature Meds ordered this encounter  Medications  . predniSONE (DELTASONE) 20 MG tablet    Sig: 2 po at sametime daily for 5 days- start tomorrow    Dispense:  10 tablet    Refill:  0    Order Specific Question:  Supervising Provider    Answer:  Chipper Herb [[id number]]  . methylPREDNISolone acetate (DEPO-MEDROL) injection 80 mg    Sig:   . hydrOXYzine (ATARAX/VISTARIL) 25 MG tablet    Sig: Take 1 tablet (25 mg total) by mouth 3 (three) times daily as needed.    Dispense:  30 tablet    Refill:  0    Order Specific Question:  Supervising Provider   Answer:  Chipper Herb [[id number]]   Avoid scratching RTO prn  Mary-Margaret Hassell Done, FNP

## 2014-07-09 NOTE — Patient Instructions (Signed)
Angioedema °Angioedema is sudden puffiness (swelling), often of the skin. It can happen: °· On your face or privates (genitals). °· In your belly (abdomen) or other body parts. °It usually happens quickly and gets better in 1 or 2 days. It often starts at night and is found when you wake up. You may get red, itchy patches of skin (hives). Attacks can be dangerous if your breathing passages get puffy. °The condition may happen only once, or it can come back at random times. It may happen for several years before it goes away for good. °HOME CARE °· Only take medicines as told by your doctor. °· Always carry your emergency allergy medicines with you. °· Wear a medical bracelet as told by your doctor. °· Avoid things that you know will cause attacks (triggers). °GET HELP IF: °· You have another attack. °· Your attacks happen more often or get worse. °· The condition was passed to you by your parents and you want to have children. °GET HELP RIGHT AWAY IF:  °· Your mouth, tongue, or lips are very puffy. °· You have trouble breathing. °· You have trouble swallowing. °· You pass out (faint). °MAKE SURE YOU:  °· Understand these instructions. °· Will watch your condition. °· Will get help right away if you are not doing well or get worse. °Document Released: 12/24/2008 Document Revised: 10/26/2012 Document Reviewed: 08/29/2012 °ExitCare® Patient Information ©2015 [employer]. This information is not intended to replace advice given to you by your health care provider. Make sure you discuss any questions you have with your health care provider. ° °

## 2014-07-11 ENCOUNTER — Encounter: Payer: Self-pay | Admitting: Family Medicine

## 2014-07-11 ENCOUNTER — Ambulatory Visit (INDEPENDENT_AMBULATORY_CARE_PROVIDER_SITE_OTHER): Payer: BLUE CROSS/BLUE SHIELD | Admitting: Family Medicine

## 2014-07-11 ENCOUNTER — Telehealth: Payer: Self-pay | Admitting: Nurse Practitioner

## 2014-07-11 VITALS — BP 153/84 | HR 107 | Temp 99.3°F | Ht 64.0 in | Wt 131.0 lb

## 2014-07-11 DIAGNOSIS — L309 Dermatitis, unspecified: Secondary | ICD-10-CM

## 2014-07-11 NOTE — Telephone Encounter (Signed)
No appts available with MMM

## 2014-07-11 NOTE — Progress Notes (Signed)
   Subjective:    Patient ID: Jasmine [name], female    DOB: 11-03-1955, 59 y.o.   MRN: [id number]  HPI 59 year old female with a rash on her face. She was seen 48 hours ago and given injection steroid-induced and tablets but there is some question as to her taking the tablets. She was given TPN they're still in her bottle which means she probably is not taking them as directed. The rash was diagnosed as nonspecific dermatitis.  Patient Active Problem List   Diagnosis Date Noted  . Chronic back pain 12/06/2013   Outpatient Encounter Prescriptions as of 07/11/2014  Medication Sig  . ALPRAZolam (XANAX) 0.25 MG tablet Take 1 tablet (0.25 mg total) by mouth 2 (two) times daily as needed for anxiety.  . busPIRone (BUSPAR) 7.5 MG tablet TAKE 1 TABLET BY MOUTH TWICE A DAY  . hydrocortisone (ANUSOL-HC) 25 MG suppository Place 1 suppository (25 mg total) rectally 2 (two) times daily.  . hydrocortisone 2.5 % cream Apply topically 2 (two) times daily.  . hydrOXYzine (ATARAX/VISTARIL) 25 MG tablet Take 1 tablet (25 mg total) by mouth 3 (three) times daily as needed.  . meloxicam (MOBIC) 15 MG tablet TAKE 1 TABLET (15 MG TOTAL) BY MOUTH DAILY.  Marland Kitchen predniSONE (DELTASONE) 20 MG tablet 2 po at sametime daily for 5 days- start tomorrow  . [DISCONTINUED] hydrOXYzine (ATARAX/VISTARIL) 25 MG tablet TAKE 1 TABLET (25 MG TOTAL) BY MOUTH 3 (THREE) TIMES DAILY AS NEEDED.   No facility-administered encounter medications on file as of 07/11/2014.      Review of Systems  Skin: Positive for rash.       Involves face arms neck back       Objective:   Physical Exam  Skin:  Rash on face is macular with swelling around her mouth extends to the malar area and up around the nasal part of her eyes and eyebrows. It reminds me of a heliotrope or dermatomyositis. I spoke with Dr. Allyson Sabal, dermatologist in [place] who had seen her in February who is willing to see her tomorrow if transportation can be arranged    BP  153/84 mmHg  Pulse 107  Ht 5\' 4"  (1.626 m)  Wt 131 lb (59.421 kg)  BMI 22.47 kg/m2 T99.3        Assessment & Plan:  1. Dermatitis Etiology is unknown. I do not that this rash is not typical and would like for her to see specialist for his input. I will not add further treatment that might confuse issue but encouraged her to take prednisone as prescribed earlier this week  Wardell Honour MD

## 2014-07-12 ENCOUNTER — Other Ambulatory Visit: Payer: Self-pay | Admitting: Dermatology

## 2015-01-20 HISTORY — PX: UTERINE FIBROID SURGERY: SHX826

## 2015-03-01 ENCOUNTER — Telehealth: Payer: Self-pay | Admitting: Nurse Practitioner

## 2015-04-16 ENCOUNTER — Ambulatory Visit (INDEPENDENT_AMBULATORY_CARE_PROVIDER_SITE_OTHER): Payer: BLUE CROSS/BLUE SHIELD | Admitting: Family

## 2015-04-16 ENCOUNTER — Encounter: Payer: Self-pay | Admitting: Family

## 2015-04-16 VITALS — BP 153/101 | HR 121 | Temp 97.3°F | Ht 64.0 in | Wt 142.0 lb

## 2015-04-16 DIAGNOSIS — L80 Vitiligo: Secondary | ICD-10-CM

## 2015-04-16 DIAGNOSIS — T7840XD Allergy, unspecified, subsequent encounter: Secondary | ICD-10-CM

## 2015-04-16 DIAGNOSIS — T7840XA Allergy, unspecified, initial encounter: Secondary | ICD-10-CM | POA: Diagnosis not present

## 2015-04-16 DIAGNOSIS — L309 Dermatitis, unspecified: Secondary | ICD-10-CM | POA: Diagnosis not present

## 2015-04-16 DIAGNOSIS — R21 Rash and other nonspecific skin eruption: Secondary | ICD-10-CM

## 2015-04-16 MED ORDER — HYDROXYZINE PAMOATE 100 MG PO CAPS: 100.0000 mg | ORAL_CAPSULE | Freq: Three times a day (TID) | ORAL | Status: DC | PRN

## 2015-04-16 MED ORDER — METHYLPREDNISOLONE ACETATE 80 MG/ML IJ SUSP
80.0000 mg | Freq: Once | INTRAMUSCULAR | Status: AC
  Administered 2015-04-16: 80 mg via INTRAMUSCULAR

## 2015-04-16 MED ORDER — HYDROXYZINE HCL 10 MG PO TABS: 10.0000 mg | ORAL_TABLET | Freq: Three times a day (TID) | ORAL | Status: DC | PRN

## 2015-04-16 NOTE — Progress Notes (Signed)
   Subjective:    Patient ID: Jasmine [name], female    DOB: [birth year], 60 y.o.   MRN: [id number]  HPI Pt presents to the office today with facial itchiness and burning. PT states this started last week and has gotten worse. Pt states she is in constant burning pain of a 10 out 10. PT denies any new contact with plants, animals, medications, or detergents.  Husband states this has happened before and she went to a dermatologists and couldn't find out what it was.  Pt is crying in pain.    Review of Systems  Constitutional: Negative.   HENT: Negative.   Eyes: Negative.   Respiratory: Negative.  Negative for shortness of breath.   Cardiovascular: Negative.  Negative for palpitations.  Gastrointestinal: Negative.   Endocrine: Negative.   Genitourinary: Negative.   Musculoskeletal: Negative.   Neurological: Negative.  Negative for headaches.  Hematological: Negative.   Psychiatric/Behavioral: Negative.   All other systems reviewed and are negative.      Objective:   Physical Exam  Constitutional: She is oriented to person, place, and time. She appears well-developed and well-nourished. No distress.  HENT:  Head: Normocephalic and atraumatic.  Right Ear: External ear normal.  Mouth/Throat: Oropharynx is clear and moist.  Eyes: Pupils are equal, round, and reactive to light.  Neck: Normal range of motion. Neck supple. No thyromegaly present.  Cardiovascular: Normal rate, regular rhythm, normal heart sounds and intact distal pulses.   No murmur heard. Pulmonary/Chest: Effort normal and breath sounds normal. No respiratory distress. She has no wheezes.  Abdominal: Soft. Bowel sounds are normal. She exhibits no distension. There is no tenderness.  Musculoskeletal: Normal range of motion. She exhibits no edema or tenderness.  Neurological: She is alert and oriented to person, place, and time.  Skin: Skin is warm and dry. Rash noted. There is erythema.  Vitiligo present on right corner  of mouth, erythemas circular lesion on right maxillary, erythemas rash on bridge of nose extending to bilateral cheeks   Psychiatric: She has a normal mood and affect. Her behavior is normal. Judgment and thought content normal.  Vitals reviewed.    BP 153/101 mmHg  Pulse 121  Temp(Src) 97.3 F (36.3 C) (Oral)  Ht '5\' 4"'$  (1.626 m)  Wt 142 lb (64.411 kg)  BMI 24.36 kg/m2      Assessment & Plan:  1. Allergic reaction, subsequent encounter - methylPREDNISolone acetate (DEPO-MEDROL) injection 80 mg; Inject 1 mL (80 mg total) into the muscle once. - Ambulatory referral to Dermatology - ANA - Sedimentation rate - CMP14+EGFR - hydrOXYzine (ATARAX/VISTARIL) 10 MG tablet; Take 1 tablet (10 mg total) by mouth 3 (three) times daily as needed.  Dispense: 60 tablet; Refill: 1  2. Facial rash - Ambulatory referral to Dermatology - ANA - Sedimentation rate - CMP14+EGFR - hydrOXYzine (ATARAX/VISTARIL) 10 MG tablet; Take 1 tablet (10 mg total) by mouth 3 (three) times daily as needed.  Dispense: 60 tablet; Refill: 1  3. Dermatitis - hydrOXYzine (ATARAX/VISTARIL) 10 MG tablet; Take 1 tablet (10 mg total) by mouth 3 (three) times daily as needed.  Dispense: 60 tablet; Refill: 1  4. Vitiligo - Thyroid Panel With TSH - Vitamin B12  Labs pending Do not scratch Atarax 100 mg TID as prn Prescription sent to pharmacy  RTO prn   Evelina Dun, FNP

## 2015-04-16 NOTE — Patient Instructions (Signed)
Contact Dermatitis Dermatitis is redness, soreness, and swelling (inflammation) of the skin. Contact dermatitis is a reaction to certain substances that touch the skin. There are two types of contact dermatitis:   Irritant contact dermatitis. This type is caused by something that irritates your skin, such as dry hands from washing them too much. This type does not require previous exposure to the substance for a reaction to occur. This type is more common.  Allergic contact dermatitis. This type is caused by a substance that you are allergic to, such as a nickel allergy or poison ivy. This type only occurs if you have been exposed to the substance (allergen) before. Upon a repeat exposure, your body reacts to the substance. This type is less common. CAUSES  Many different substances can cause contact dermatitis. Irritant contact dermatitis is most commonly caused by exposure to:   Makeup.   Soaps.   Detergents.   Bleaches.   Acids.   Metal salts, such as nickel.  Allergic contact dermatitis is most commonly caused by exposure to:   Poisonous plants.   Chemicals.   Jewelry.   Latex.   Medicines.   Preservatives in products, such as clothing.  RISK FACTORS This condition is more likely to develop in:   People who have jobs that expose them to irritants or allergens.  People who have certain medical conditions, such as asthma or eczema.  SYMPTOMS  Symptoms of this condition may occur anywhere on your body where the irritant has touched you or is touched by you. Symptoms include:  Dryness or flaking.   Redness.   Cracks.   Itching.   Pain or a burning feeling.   Blisters.  Drainage of small amounts of blood or clear fluid from skin cracks. With allergic contact dermatitis, there may also be swelling in areas such as the eyelids, mouth, or genitals.  DIAGNOSIS  This condition is diagnosed with a medical history and physical exam. A patch skin test  may be performed to help determine the cause. If the condition is related to your job, you may need to see an occupational medicine specialist. TREATMENT Treatment for this condition includes figuring out what caused the reaction and protecting your skin from further contact. Treatment may also include:   Steroid creams or ointments. Oral steroid medicines may be needed in more severe cases.  Antibiotics or antibacterial ointments, if a skin infection is present.  Antihistamine lotion or an antihistamine taken by mouth to ease itching.  A bandage (dressing). HOME CARE INSTRUCTIONS Skin Care  Moisturize your skin as needed.   Apply cool compresses to the affected areas.  Try taking a bath with:  Epsom salts. Follow the instructions on the packaging. You can get these at your local pharmacy or grocery store.  Baking soda. Pour a small amount into the bath as directed by your health care provider.  Colloidal oatmeal. Follow the instructions on the packaging. You can get this at your local pharmacy or grocery store.  Try applying baking soda paste to your skin. Stir water into baking soda until it reaches a paste-like consistency.  Do not scratch your skin.  Bathe less frequently, such as every other day.  Bathe in lukewarm water. Avoid using hot water. Medicines  Take or apply over-the-counter and prescription medicines only as told by your health care provider.   If you were prescribed an antibiotic medicine, take or apply your antibiotic as told by your health care provider. Do not stop using the   antibiotic even if your condition starts to improve. General Instructions  Keep all follow-up visits as told by your health care provider. This is important.  Avoid the substance that caused your reaction. If you do not know what caused it, keep a journal to try to track what caused it. Write down:  What you eat.  What cosmetic products you use.  What you drink.  What  you wear in the affected area. This includes jewelry.  If you were given a dressing, take care of it as told by your health care provider. This includes when to change and remove it. SEEK MEDICAL CARE IF:   Your condition does not improve with treatment.  Your condition gets worse.  You have signs of infection such as swelling, tenderness, redness, soreness, or warmth in the affected area.  You have a fever.  You have new symptoms. SEEK IMMEDIATE MEDICAL CARE IF:   You have a severe headache, neck pain, or neck stiffness.  You vomit.  You feel very sleepy.  You notice red streaks coming from the affected area.  Your bone or joint underneath the affected area becomes painful after the skin has healed.  The affected area turns darker.  You have difficulty breathing.   This information is not intended to replace advice given to you by your health care provider. Make sure you discuss any questions you have with your health care provider.   Document Released: 01/03/2000 Document Revised: 09/26/2014 Document Reviewed: 05/23/2014 Elsevier Interactive Patient Education 2016 [employer]  

## 2015-04-17 LAB — CMP14+EGFR
ALT: 14 IU/L (ref 0–32)
AST: 29 IU/L (ref 0–40)
Albumin/Globulin Ratio: 1.9 (ref 1.2–2.2)
Albumin: 5 g/dL (ref 3.5–5.5)
Alkaline Phosphatase: 80 IU/L (ref 39–117)
BUN/Creatinine Ratio: 8 — ABNORMAL LOW (ref 9–23)
BUN: 6 mg/dL (ref 6–24)
Bilirubin Total: 0.5 mg/dL (ref 0.0–1.2)
CO2: 24 mmol/L (ref 18–29)
Calcium: 9.7 mg/dL (ref 8.7–10.2)
Chloride: 102 mmol/L (ref 96–106)
Creatinine, Ser: 0.73 mg/dL (ref 0.57–1.00)
GFR calc Af Amer: 104 mL/min/{1.73_m2} (ref >59)
GFR calc non Af Amer: 90 mL/min/{1.73_m2} (ref >59)
Globulin, Total: 2.7 g/dL (ref 1.5–4.5)
Glucose: 89 mg/dL (ref 65–99)
Potassium: 4.4 mmol/L (ref 3.5–5.2)
Sodium: 144 mmol/L (ref 134–144)
Total Protein: 7.7 g/dL (ref 6.0–8.5)

## 2015-04-17 LAB — THYROID PANEL WITH TSH
Free Thyroxine Index: 2.2 (ref 1.2–4.9)
T3 Uptake Ratio: 28 % (ref 24–39)
T4, Total: 8 ug/dL (ref 4.5–12.0)
TSH: 1.94 u[IU]/mL (ref 0.450–4.500)

## 2015-04-17 LAB — VITAMIN B12: Vitamin B-12: 489 pg/mL (ref 211–946)

## 2015-04-17 LAB — SEDIMENTATION RATE: Sed Rate: 9 mm/hr (ref 0–40)

## 2015-04-17 LAB — ANA: Anti Nuclear Antibody(ANA): NEGATIVE

## 2015-07-18 ENCOUNTER — Other Ambulatory Visit: Payer: Self-pay | Admitting: Family

## 2015-08-05 ENCOUNTER — Encounter: Payer: Self-pay | Admitting: Family

## 2015-08-05 ENCOUNTER — Ambulatory Visit (INDEPENDENT_AMBULATORY_CARE_PROVIDER_SITE_OTHER): Payer: BLUE CROSS/BLUE SHIELD | Admitting: Family

## 2015-08-05 VITALS — BP 121/76 | HR 86 | Temp 98.0°F | Ht 64.0 in | Wt 150.6 lb

## 2015-08-05 DIAGNOSIS — R21 Rash and other nonspecific skin eruption: Secondary | ICD-10-CM

## 2015-08-05 DIAGNOSIS — L309 Dermatitis, unspecified: Secondary | ICD-10-CM

## 2015-08-05 MED ORDER — METHYLPREDNISOLONE ACETATE 80 MG/ML IJ SUSP
80.0000 mg | Freq: Once | INTRAMUSCULAR | Status: AC
  Administered 2015-08-05: 80 mg via INTRAMUSCULAR

## 2015-08-05 NOTE — Progress Notes (Signed)
   Subjective:    Patient ID: Jasmine [name], female    DOB: [birth year], 60 y.o.   MRN: [id number]  PT presents to the office today for recurrent skin irration, itching, and burning on her face. Pt has seen several dermatologists and has an appointment tomorrow with them.  Dermatologists has given pt steroid injection, steroid dose pack, and desonide cream 0.05%. Pt states this relieved the itching and burning, but return after she quit taking the medication. Pt states the sunlight makes the pain worse. Rash This is a recurrent problem. The current episode started more than 1 month ago. The affected locations include the face. The rash is characterized by itchiness and pain. She was exposed to nothing. Pertinent negatives include no fever. Past treatments include antihistamine, anti-itch cream, antibiotic cream, oral steroids and topical steroids.      Review of Systems  Constitutional: Negative for fever.  Skin: Positive for rash.  All other systems reviewed and are negative.      Objective:   Physical Exam  Constitutional: She is oriented to person, place, and time. She appears well-developed and well-nourished. No distress.  HENT:  Head: Normocephalic.  Eyes: Pupils are equal, round, and reactive to light.  Neck: Normal range of motion. Neck supple. No thyromegaly present.  Cardiovascular: Normal rate, regular rhythm, normal heart sounds and intact distal pulses.   No murmur heard. Pulmonary/Chest: Effort normal and breath sounds normal. No respiratory distress. She has no wheezes.  Abdominal: Soft. Bowel sounds are normal. She exhibits no distension. There is no tenderness.  Musculoskeletal: Normal range of motion. She exhibits no edema or tenderness.  Neurological: She is alert and oriented to person, place, and time.  Skin: Skin is warm and dry. Rash noted. There is erythema.  Generalized erythemas on bilateral cheeks and forehead   Psychiatric: She has a normal mood and  affect. Her behavior is normal. Judgment and thought content normal.  Vitals reviewed.   BP 121/76 mmHg  Pulse 86  Temp(Src) 98 F (36.7 C) (Oral)  Ht 5\' 4"  (1.626 m)  Wt 150 lb 9.6 oz (68.312 kg)  BMI 25.84 kg/m2       Assessment & Plan:  1. Dermatitis - methylPREDNISolone acetate (DEPO-MEDROL) injection 80 mg; Inject 1 mL (80 mg total) into the muscle once.  2. Facial rash - methylPREDNISolone acetate (DEPO-MEDROL) injection 80 mg; Inject 1 mL (80 mg total) into the muscle once.  Keep appt with Dermatologists tomorrow Depo-medrol injection given today Possible reaction to sun? Allergy? Polymorphous light eruption? Continue steroid cream RTO prn  Evelina Dun, FNP

## 2015-08-05 NOTE — Patient Instructions (Addendum)
Keep appt with Dermatologists

## 2015-08-07 ENCOUNTER — Encounter: Payer: Self-pay | Admitting: Nurse Practitioner

## 2015-08-30 ENCOUNTER — Other Ambulatory Visit: Payer: Self-pay | Admitting: Family

## 2015-08-30 MED ORDER — HYDROXYZINE PAMOATE 50 MG PO CAPS: 50.0000 mg | ORAL_CAPSULE | Freq: Three times a day (TID) | ORAL | 0 refills | Status: DC | PRN

## 2016-06-09 ENCOUNTER — Telehealth: Payer: Self-pay | Admitting: Nurse Practitioner

## 2016-07-29 ENCOUNTER — Telehealth: Payer: Self-pay | Admitting: Nurse Practitioner

## 2016-12-18 ENCOUNTER — Ambulatory Visit: Payer: BLUE CROSS/BLUE SHIELD | Admitting: Family Medicine

## 2016-12-18 ENCOUNTER — Encounter: Payer: Self-pay | Admitting: Family Medicine

## 2016-12-18 VITALS — BP 129/84 | HR 79 | Temp 98.6°F | Ht 64.0 in | Wt 168.0 lb

## 2016-12-18 DIAGNOSIS — Z23 Encounter for immunization: Secondary | ICD-10-CM

## 2016-12-18 DIAGNOSIS — M5440 Lumbago with sciatica, unspecified side: Secondary | ICD-10-CM

## 2016-12-18 DIAGNOSIS — F0391 Unspecified dementia with behavioral disturbance: Secondary | ICD-10-CM

## 2016-12-18 DIAGNOSIS — G8929 Other chronic pain: Secondary | ICD-10-CM

## 2016-12-18 DIAGNOSIS — L309 Dermatitis, unspecified: Secondary | ICD-10-CM | POA: Diagnosis not present

## 2016-12-18 LAB — URINALYSIS
Bilirubin, UA: NEGATIVE
Glucose, UA: NEGATIVE
Ketones, UA: NEGATIVE
Nitrite, UA: NEGATIVE
Protein, UA: NEGATIVE
Specific Gravity, UA: 1.025 (ref 1.005–1.030)
Urobilinogen, Ur: 0.2 mg/dL (ref 0.2–1.0)
pH, UA: 5.5 (ref 5.0–7.5)

## 2016-12-18 MED ORDER — TRAZODONE HCL 100 MG PO TABS: 100.0000 mg | ORAL_TABLET | Freq: Every day | ORAL | 1 refills | Status: DC

## 2016-12-18 MED ORDER — TACROLIMUS 0.03 % EX OINT: TOPICAL_OINTMENT | Freq: Two times a day (BID) | CUTANEOUS | 5 refills | Status: DC

## 2016-12-18 MED ORDER — DONEPEZIL HCL 10 MG PO TABS: 10.0000 mg | ORAL_TABLET | Freq: Every day | ORAL | 1 refills | Status: DC

## 2016-12-18 NOTE — Progress Notes (Signed)
Subjective:  Patient ID: Joaquin Music, female    DOB: August 30, 1955  Age: 61 y.o. MRN: [id number]  CC: Follow-up (check up )   HPI Joaquin Music presents for checkup relating to her dementia.  Of note is that she has exhibited confusion forgetfulness and was taken to a physician in [place] by her daughter while visiting there recently.  He started her on donepezil.  She is also been taking various supplements for memory and menopause etc.  Husband is concerned that she just does not seem to be improving.  Patient tells me that she is having pain at the right lower back at the L5-S1 area laterally toward the right.  It did stop chronic pain according to the husband that has not changed recently.  She is very repetitive about this and each time she mentions it as if it is the first time it has come up during the visit.  She is taking a vitamin B12 and a vitamin C supplement as well as a Remifemin supplement and OTC insomnia supplement.  Her husband says she is waking up every hour and rolling over and waking him up and telling him that she has to go to the bathroom.  There is no reason for him to be waked up.  She is able to go on her own she is physically capable.  This is just 1 of the scenario that he is concerned about that his coming from her dementia.  Depression screen [facility] 2/9 12/18/2016 08/05/2015 12/06/2013  Decreased Interest 2 0 1  Down, Depressed, Hopeless 2 0 2  PHQ - 2 Score 4 0 3  Altered sleeping 2 - 0  Tired, decreased energy 0 - 1  Change in appetite 0 - 1  Feeling bad or failure about yourself  1 - 2  Trouble concentrating 1 - 0  Moving slowly or fidgety/restless 1 - 0  Suicidal thoughts 1 - 0  PHQ-9 Score 10 - 7  Difficult doing work/chores Somewhat difficult - -    History Jhoselin has a past medical history of Anxiety, Dementia, and Depression.   She has a past surgical history that includes Breast enhancement surgery.   Her family history is not on file.She  reports that  has never smoked. she has never used smokeless tobacco. She reports that she drinks alcohol. She reports that she does not use drugs.    ROS Review of Systems  Constitutional: Negative for activity change, appetite change and fever.  HENT: Negative for congestion, rhinorrhea and sore throat.   Eyes: Negative for visual disturbance.  Respiratory: Negative for cough and shortness of breath.   Cardiovascular: Negative for chest pain and palpitations.  Gastrointestinal: Negative for abdominal pain, diarrhea and nausea.  Genitourinary: Negative for dysuria.  Musculoskeletal: Negative for arthralgias and myalgias.  Psychiatric/Behavioral: Positive for agitation, behavioral problems, confusion, decreased concentration and sleep disturbance.    Objective:  BP 129/84 (BP Location: Left Arm)   Pulse 79   Temp 98.6 F (37 C) (Oral)   Ht 5' 4" (1.626 m)   Wt 168 lb (76.2 kg)   BMI 28.84 kg/m   BP Readings from Last 3 Encounters:  12/18/16 129/84  08/05/15 121/76  04/16/15 (!) 153/101    Wt Readings from Last 3 Encounters:  12/18/16 168 lb (76.2 kg)  08/05/15 150 lb 9.6 oz (68.3 kg)  04/16/15 142 lb (64.4 kg)     Physical Exam  Constitutional: She is oriented to person, place, and time.  She appears well-developed and well-nourished. No distress.  HENT:  Head: Normocephalic and atraumatic.  Right Ear: External ear normal.  Left Ear: External ear normal.  Nose: Nose normal.  Mouth/Throat: Oropharynx is clear and moist.  Eyes: Conjunctivae and EOM are normal. Pupils are equal, round, and reactive to light.  Neck: Normal range of motion. Neck supple. No thyromegaly present.  Cardiovascular: Normal rate, regular rhythm and normal heart sounds.  No murmur heard. Pulmonary/Chest: Effort normal and breath sounds normal. No respiratory distress. She has no wheezes. She has no rales.  Abdominal: Soft. Bowel sounds are normal. She exhibits no distension. There is no  tenderness.  Lymphadenopathy:    She has no cervical adenopathy.  Neurological: She is alert and oriented to person, place, and time. She has normal reflexes.  Skin: Skin is warm and dry.  Decreased pigmentation around the mouth particularly at the right angle.  There is some fine scale of the chin.  There are also depigmented patches on the forearms  Psychiatric: Her mood appears anxious. Her affect is labile. Her speech is rapid and/or pressured. She is agitated and aggressive. Thought content is delusional. Cognition and memory are impaired. She expresses impulsivity.   Patient was unable to focus to complete the MMSE tonight however overall my observations were that she was cognitively impaired from the conversational aspects of our visit and interview.   Assessment & Plan:   Amaiah was seen today for follow-up.  Diagnoses and all orders for this visit:  Dementia with behavioral disturbance, unspecified dementia type -     CBC with Differential/Platelet -     CMP14+EGFR -     Lipid panel -     Urinalysis -     TSH  Need for immunization against influenza -     Flu Vaccine QUAD 36+ mos IM  Chronic right-sided low back pain with sciatica, sciatica laterality unspecified  Dermatitis  Other orders -     donepezil (ARICEPT) 10 MG tablet; Take 1 tablet (10 mg total) by mouth at bedtime. -     traZODone (DESYREL) 100 MG tablet; Take 1 tablet (100 mg total) by mouth at bedtime. -     tacrolimus (PROTOPIC) 0.03 % ointment; Apply topically 2 (two) times daily.       I have discontinued Cande Yahnke's desonide, hydrOXYzine, and donepezil. I am also having her start on donepezil, traZODone, and tacrolimus. Additionally, I am having her maintain her [place].  Allergies as of 12/18/2016      Reactions   Shellfish Allergy    Sulfa Antibiotics    Lexapro [escitalopram Oxalate] Rash      Medication List        Accurate as of 12/18/16  8:21 PM. Always  use your most recent med list.          donepezil 10 MG tablet Commonly known as:  ARICEPT Take 1 tablet (10 mg total) by mouth at bedtime.   OVER THE COUNTER MEDICATION ginko biloba, melatonin, B12, vit C, phosphatidyl choline, OTC menopause support, and CEBRIA   tacrolimus 0.03 % ointment Commonly known as:  PROTOPIC Apply topically 2 (two) times daily.   traZODone 100 MG tablet Commonly known as:  DESYREL Take 1 tablet (100 mg total) by mouth at bedtime.        Follow-up: Return in about 6 weeks (around 01/29/2017).  Claretta Fraise, M.D.

## 2016-12-18 NOTE — Patient Instructions (Signed)
Moisturize face twice a day with Eucerin (otc)

## 2016-12-19 LAB — CBC WITH DIFFERENTIAL/PLATELET
Basophils Absolute: 0 10*3/uL (ref 0.0–0.2)
Basos: 0 %
EOS (ABSOLUTE): 0.1 10*3/uL (ref 0.0–0.4)
Eos: 1 %
Hematocrit: 38.2 % (ref 34.0–46.6)
Hemoglobin: 12.9 g/dL (ref 11.1–15.9)
Immature Grans (Abs): 0 10*3/uL (ref 0.0–0.1)
Immature Granulocytes: 0 %
Lymphocytes Absolute: 1.2 10*3/uL (ref 0.7–3.1)
Lymphs: 16 %
MCH: 29.7 pg (ref 26.6–33.0)
MCHC: 33.8 g/dL (ref 31.5–35.7)
MCV: 88 fL (ref 79–97)
Monocytes Absolute: 0.7 10*3/uL (ref 0.1–0.9)
Monocytes: 9 %
Neutrophils Absolute: 5.7 10*3/uL (ref 1.4–7.0)
Neutrophils: 74 %
Platelets: 273 10*3/uL (ref 150–379)
RBC: 4.35 x10E6/uL (ref 3.77–5.28)
RDW: 14.5 % (ref 12.3–15.4)
WBC: 7.7 10*3/uL (ref 3.4–10.8)

## 2016-12-19 LAB — CMP14+EGFR
ALT: 24 IU/L (ref 0–32)
AST: 33 IU/L (ref 0–40)
Albumin/Globulin Ratio: 2 (ref 1.2–2.2)
Albumin: 4.8 g/dL (ref 3.6–4.8)
Alkaline Phosphatase: 88 IU/L (ref 39–117)
BUN/Creatinine Ratio: 10 — ABNORMAL LOW (ref 12–28)
BUN: 9 mg/dL (ref 8–27)
Bilirubin Total: 0.5 mg/dL (ref 0.0–1.2)
CO2: 25 mmol/L (ref 20–29)
Calcium: 9.8 mg/dL (ref 8.7–10.3)
Chloride: 103 mmol/L (ref 96–106)
Creatinine, Ser: 0.86 mg/dL (ref 0.57–1.00)
GFR calc Af Amer: 84 mL/min/{1.73_m2} (ref >59)
GFR calc non Af Amer: 73 mL/min/{1.73_m2} (ref >59)
Globulin, Total: 2.4 g/dL (ref 1.5–4.5)
Glucose: 63 mg/dL — ABNORMAL LOW (ref 65–99)
Potassium: 4.5 mmol/L (ref 3.5–5.2)
Sodium: 144 mmol/L (ref 134–144)
Total Protein: 7.2 g/dL (ref 6.0–8.5)

## 2016-12-19 LAB — LIPID PANEL
Chol/HDL Ratio: 2 ratio (ref 0.0–4.4)
Cholesterol, Total: 190 mg/dL (ref 100–199)
HDL: 96 mg/dL (ref >39)
LDL Calculated: 86 mg/dL (ref 0–99)
Triglycerides: 38 mg/dL (ref 0–149)
VLDL Cholesterol Cal: 8 mg/dL (ref 5–40)

## 2016-12-19 LAB — TSH: TSH: 2.41 u[IU]/mL (ref 0.450–4.500)

## 2017-02-02 ENCOUNTER — Encounter: Payer: Self-pay | Admitting: Nurse Practitioner

## 2017-02-02 ENCOUNTER — Ambulatory Visit: Payer: BLUE CROSS/BLUE SHIELD | Admitting: Nurse Practitioner

## 2017-02-02 VITALS — BP 121/78 | HR 81 | Temp 96.7°F | Ht 64.0 in | Wt 161.0 lb

## 2017-02-02 DIAGNOSIS — G4700 Insomnia, unspecified: Secondary | ICD-10-CM

## 2017-02-02 MED ORDER — MIRTAZAPINE 30 MG PO TABS
30.0000 mg | ORAL_TABLET | Freq: Every day | ORAL | 2 refills | Status: DC
Start: 2017-02-02 — End: 2017-03-01

## 2017-02-02 NOTE — Patient Instructions (Signed)

## 2017-02-02 NOTE — Progress Notes (Addendum)
   Subjective:    Patient ID: Jasmine [name], female    DOB: 1955/11/14, 62 y.o.   MRN: [id number]  HPI  Patient comes in  Today with her husband he says she is doing pretty well right now other then she is not able to sleep and she keeps him up all night. She thinks she is sleeping fine. He says she sits up in bed and talks to herself. Her husband brought her in to see Dr. Livia Snellen 6 weeks ago for dementia. Her husband says that she does nt remember a lot of stuff. She was started on aricept and hr husband has nit seen any difference in her yet. Dr. Livia Snellen also gave her trazadone to sleep and that has not helped at all. He has been giving her 2 melatonin with it and that has not helped either.   Review of Systems  Constitutional: Negative.   Respiratory: Negative.   Cardiovascular: Negative.   Genitourinary: Negative.   Neurological: Negative.   Psychiatric/Behavioral: Positive for sleep disturbance.  All other systems reviewed and are negative.      Objective:   Physical Exam  Constitutional: She appears well-developed and well-nourished. No distress.  Cardiovascular: Normal rate and regular rhythm.  Murmur (3/6 systolic murmur) heard. Pulmonary/Chest: Effort normal and breath sounds normal.  Neurological: She is alert.  Rambling at times during visit.  Skin: Skin is warm.  Psychiatric: She has a normal mood and affect. Her behavior is normal. Judgment and thought content normal.   BP 121/78   Pulse 81   Temp (!) 96.7 F (35.9 C) (Oral)   Ht 5\' 4"  (1.626 m)   Wt 161 lb (73 kg)   BMI 27.64 kg/m       Assessment & Plan:  1. Insomnia, unspecified type No napping during the day Bedtime routine - mirtazapine (REMERON) 30 MG tablet; Take 1 tablet (30 mg total) by mouth at bedtime.  Dispense: 30 tablet; Refill: 2  Mary-Margaret Hassell Done, FNP

## 2017-03-01 ENCOUNTER — Other Ambulatory Visit: Payer: Self-pay | Admitting: *Deleted

## 2017-03-01 DIAGNOSIS — G4700 Insomnia, unspecified: Secondary | ICD-10-CM

## 2017-03-01 MED ORDER — MIRTAZAPINE 30 MG PO TABS: 30.0000 mg | ORAL_TABLET | Freq: Every day | ORAL | 0 refills | Status: DC

## 2017-03-11 ENCOUNTER — Other Ambulatory Visit: Payer: Self-pay | Admitting: *Deleted

## 2017-05-03 ENCOUNTER — Encounter: Payer: Self-pay | Admitting: Nurse Practitioner

## 2017-05-03 ENCOUNTER — Ambulatory Visit: Payer: BLUE CROSS/BLUE SHIELD | Admitting: Nurse Practitioner

## 2017-05-03 DIAGNOSIS — F0390 Unspecified dementia without behavioral disturbance: Secondary | ICD-10-CM | POA: Insufficient documentation

## 2017-05-04 ENCOUNTER — Encounter: Payer: Self-pay | Admitting: Nurse Practitioner

## 2017-06-01 ENCOUNTER — Other Ambulatory Visit: Payer: Self-pay | Admitting: Nurse Practitioner

## 2017-06-01 DIAGNOSIS — G4700 Insomnia, unspecified: Secondary | ICD-10-CM

## 2017-06-05 ENCOUNTER — Other Ambulatory Visit: Payer: Self-pay | Admitting: Family Medicine

## 2017-06-18 ENCOUNTER — Other Ambulatory Visit: Payer: Self-pay | Admitting: Family Medicine

## 2017-08-19 ENCOUNTER — Telehealth: Payer: Self-pay | Admitting: Nurse Practitioner

## 2017-08-19 NOTE — Telephone Encounter (Signed)
Pt's husband advised to take her to ED per MMM. Pt's husband voiced understanding.

## 2017-09-12 ENCOUNTER — Other Ambulatory Visit: Payer: Self-pay | Admitting: Nurse Practitioner

## 2017-09-12 DIAGNOSIS — G4700 Insomnia, unspecified: Secondary | ICD-10-CM

## 2017-09-13 NOTE — Telephone Encounter (Signed)
Last seen 02/02/17  MMM 

## 2017-10-10 ENCOUNTER — Other Ambulatory Visit: Payer: Self-pay | Admitting: Family Medicine

## 2017-10-11 ENCOUNTER — Other Ambulatory Visit: Payer: Self-pay | Admitting: Family Medicine

## 2017-10-29 ENCOUNTER — Other Ambulatory Visit: Payer: Self-pay

## 2017-10-29 ENCOUNTER — Emergency Department (HOSPITAL_COMMUNITY)
Admission: EM | Admit: 2017-10-29 | Discharge: 2017-10-29 | Disposition: A | Discharge status: Home / Self Care | Payer: BLUE CROSS/BLUE SHIELD | Attending: Emergency Medicine | Admitting: Emergency Medicine

## 2017-10-29 ENCOUNTER — Encounter (HOSPITAL_COMMUNITY): Payer: Self-pay

## 2017-10-29 ENCOUNTER — Emergency Department (HOSPITAL_COMMUNITY): Payer: BLUE CROSS/BLUE SHIELD

## 2017-10-29 DIAGNOSIS — R0602 Shortness of breath: Secondary | ICD-10-CM | POA: Diagnosis present

## 2017-10-29 DIAGNOSIS — R0600 Dyspnea, unspecified: Secondary | ICD-10-CM | POA: Diagnosis not present

## 2017-10-29 DIAGNOSIS — Z79899 Other long term (current) drug therapy: Secondary | ICD-10-CM | POA: Diagnosis not present

## 2017-10-29 DIAGNOSIS — F0390 Unspecified dementia without behavioral disturbance: Secondary | ICD-10-CM | POA: Diagnosis not present

## 2017-10-29 DIAGNOSIS — F419 Anxiety disorder, unspecified: Secondary | ICD-10-CM | POA: Insufficient documentation

## 2017-10-29 DIAGNOSIS — F329 Major depressive disorder, single episode, unspecified: Secondary | ICD-10-CM | POA: Insufficient documentation

## 2017-10-29 LAB — CBC WITH DIFFERENTIAL/PLATELET
Abs Immature Granulocytes: 0.09 10*3/uL — ABNORMAL HIGH (ref 0.00–0.07)
Basophils Absolute: 0 10*3/uL (ref 0.0–0.1)
Basophils Relative: 0 %
Eosinophils Absolute: 0 10*3/uL (ref 0.0–0.5)
Eosinophils Relative: 0 %
HCT: 36 % (ref 36.0–46.0)
Hemoglobin: 11.3 g/dL — ABNORMAL LOW (ref 12.0–15.0)
Immature Granulocytes: 1 %
Lymphocytes Relative: 8 %
Lymphs Abs: 0.8 10*3/uL (ref 0.7–4.0)
MCH: 29 pg (ref 26.0–34.0)
MCHC: 31.4 g/dL (ref 30.0–36.0)
MCV: 92.5 fL (ref 80.0–100.0)
Monocytes Absolute: 1 10*3/uL (ref 0.1–1.0)
Monocytes Relative: 9 %
Neutro Abs: 8.9 10*3/uL — ABNORMAL HIGH (ref 1.7–7.7)
Neutrophils Relative %: 82 %
Platelets: 222 10*3/uL (ref 150–400)
RBC: 3.89 MIL/uL (ref 3.87–5.11)
RDW: 14.7 % (ref 11.5–15.5)
WBC: 10.9 10*3/uL — ABNORMAL HIGH (ref 4.0–10.5)
nRBC: 0 % (ref 0.0–0.2)

## 2017-10-29 LAB — URINALYSIS, ROUTINE W REFLEX MICROSCOPIC
Bilirubin Urine: NEGATIVE
Glucose, UA: NEGATIVE mg/dL
Hgb urine dipstick: NEGATIVE
Ketones, ur: NEGATIVE mg/dL
Nitrite: POSITIVE — AB
Protein, ur: 100 mg/dL — AB
Specific Gravity, Urine: 1.02 (ref 1.005–1.030)
WBC, UA: >50 WBC/hpf — ABNORMAL HIGH (ref 0–5)
pH: 6 (ref 5.0–8.0)

## 2017-10-29 LAB — COMPREHENSIVE METABOLIC PANEL
ALT: 17 U/L (ref 0–44)
AST: 34 U/L (ref 15–41)
Albumin: 3.7 g/dL (ref 3.5–5.0)
Alkaline Phosphatase: 55 U/L (ref 38–126)
Anion gap: 7 (ref 5–15)
BUN: 9 mg/dL (ref 8–23)
CO2: 24 mmol/L (ref 22–32)
Calcium: 8.7 mg/dL — ABNORMAL LOW (ref 8.9–10.3)
Chloride: 111 mmol/L (ref 98–111)
Creatinine, Ser: 0.64 mg/dL (ref 0.44–1.00)
GFR calc Af Amer: >60 mL/min (ref >60)
GFR calc non Af Amer: >60 mL/min (ref >60)
Glucose, Bld: 90 mg/dL (ref 70–99)
Potassium: 3.6 mmol/L (ref 3.5–5.1)
Sodium: 142 mmol/L (ref 135–145)
Total Bilirubin: 0.6 mg/dL (ref 0.3–1.2)
Total Protein: 6.5 g/dL (ref 6.5–8.1)

## 2017-10-29 LAB — CBG MONITORING, ED: Glucose-Capillary: 67 mg/dL — ABNORMAL LOW (ref 70–99)

## 2017-10-29 MED ORDER — LORAZEPAM 1 MG PO TABS: 0.5000 mg | ORAL_TABLET | Freq: Two times a day (BID) | ORAL | 0 refills | Status: DC | PRN

## 2017-10-29 NOTE — ED Triage Notes (Addendum)
EMS called for SOB later changed to LOC. Husband said potentially seizure like activity. Hx of severe dementia. Home had urine in the floor/ dirty.  Pt has fear of strangers due to dementia then becomes combative with EMS. En route EMS gave IM haldol 2.5, Versed IM 5mg . At the EMS bay 2.5 Versed IV.  20g L hand with EMS CBG 128

## 2017-10-29 NOTE — Discharge Instructions (Addendum)
Return if concern for any reason .  Please follow-up with Ms. Ainsley Spinner primary care physician for medication refills or as needed

## 2017-10-29 NOTE — ED Provider Notes (Signed)
[facility] EMERGENCY DEPARTMENT Provider Note   CSN: [id number] Arrival date & time: 10/29/17  4098     History   Chief Complaint Chief Complaint  Patient presents with  . Shortness of Breath  . Altered Mental Status    HPI Jasmine [name] is a 62 y.o. female.  Level 5 caveat dementia.  History is obtained from EMS and from patient's husband by telephone  HPI Patient's husband saw her with abnormal noisy breathing while she was asleep at 5 AM today.  She was "foaming at the mouth", while asleep.  He saw no seizure activity patient was at her baseline when she went to bed last night EMS was called.  EMS found patient to be combative in route treated patient with Haldol 2.5 mg IM Versed 5 mg IM and Versed 2.5 mg IV after establishing IV access CBG in the field 128 Past Medical History:  Diagnosis Date  . Anxiety   . Dementia ([place])   . Depression     Patient Active Problem List   Diagnosis Date Noted  . Dementia ([place]) 05/03/2017  . Dermatitis 08/05/2015  . Chronic back pain 12/06/2013    Past Surgical History:  Procedure Laterality Date  . BREAST ENHANCEMENT SURGERY       OB History   None      Home Medications    Prior to Admission medications   Medication Sig Start Date End Date Taking? Authorizing Provider  donepezil (ARICEPT) 10 MG tablet Take 1 tablet (10 mg total) by mouth at bedtime. (Needs to be seen before next refill) 10/11/17   Claretta Fraise, MD  mirtazapine (REMERON) 30 MG tablet TAKE 1 TABLET (30 MG TOTAL) BY MOUTH AT BEDTIME. 09/13/17   Hassell Done, Mary-Margaret, FNP  OVER THE COUNTER MEDICATION ginko biloba, melatonin, B12, vit C, phosphatidyl choline, OTC menopause support, and CEBRIA    [provider]  tacrolimus (PROTOPIC) 0.03 % ointment Apply topically 2 (two) times daily. 12/18/16   Claretta Fraise, MD    Family History History reviewed. No pertinent family history.  Social History Social History   Tobacco Use  . Smoking status:  Never Smoker  . Smokeless tobacco: Never Used  Substance Use Topics  . Alcohol use: Yes    Comment: social  . Drug use: No     Allergies   Shellfish allergy; Sulfa antibiotics; and Lexapro [escitalopram oxalate]   Review of Systems Review of Systems  Unable to perform ROS: Dementia  Respiratory:       Abnormal breathing     Physical Exam Updated Vital Signs BP 103/75 (BP Location: Left Arm)   Pulse 88 Comment: Simultaneous filing. User may not have seen previous data.  Temp 97.7 F (36.5 C) (Oral)   Resp 16 Comment: Simultaneous filing. User may not have seen previous data.  Ht 5\' 3"  (1.6 m)   Wt 73 kg   SpO2 98% Comment: Simultaneous filing. User may not have seen previous data.  BMI 28.51 kg/m   Physical Exam  Constitutional:  Chronically ill-appearing  HENT:  Head: Normocephalic and atraumatic.  Eyes: Pupils are equal, round, and reactive to light. Conjunctivae are normal.  Neck: Neck supple. No tracheal deviation present. No thyromegaly present.  Cardiovascular: Normal rate and regular rhythm.  No murmur heard. Pulmonary/Chest: Effort normal and breath sounds normal. No respiratory distress.  Abdominal: Soft. Bowel sounds are normal. She exhibits no distension. There is no tenderness.  Musculoskeletal: Normal range of motion. She exhibits no edema or tenderness.  Neurological: Coordination normal.  Sleepy arousable to gentle tactile stimulus.  Moves all extremities does not follow simple commands  Skin: Skin is warm and dry. Capillary refill takes less than 2 seconds. No rash noted.  Psychiatric: She has a normal mood and affect.  Nursing note and vitals reviewed.    ED Treatments / Results  Labs (all labs ordered are listed, but only abnormal results are displayed) Labs Reviewed  COMPREHENSIVE METABOLIC PANEL  CBC WITH DIFFERENTIAL/PLATELET  URINALYSIS, ROUTINE W REFLEX MICROSCOPIC  CBG MONITORING, ED    EKG EKG  Interpretation  Date/Time:  Friday October 29 2017 07:03:48 EDT Ventricular Rate:  96 PR Interval:    QRS Duration: 90 QT Interval:  368 QTC Calculation: 465 R Axis:   66 Text Interpretation:  Sinus rhythm Normal ECG No old tracing to compare Confirmed by Orlie Dakin [phone]) on 10/29/2017 9:34:48 AM   Radiology No results found.  Procedures Procedures (including critical care time)  Medications Ordered in ED Medications - No data to display  Results for orders placed or performed during the hospital encounter of 10/29/17  Urinalysis, Routine w reflex microscopic  Result Value Ref Range   Color, Urine YELLOW YELLOW   APPearance CLOUDY (A) CLEAR   Specific Gravity, Urine 1.020 1.005 - 1.030   pH 6.0 5.0 - 8.0   Glucose, UA NEGATIVE NEGATIVE mg/dL   Hgb urine dipstick NEGATIVE NEGATIVE   Bilirubin Urine NEGATIVE NEGATIVE   Ketones, ur NEGATIVE NEGATIVE mg/dL   Protein, ur 100 (A) NEGATIVE mg/dL   Nitrite POSITIVE (A) NEGATIVE   Leukocytes, UA MODERATE (A) NEGATIVE   RBC / HPF 0-5 0 - 5 RBC/hpf   WBC, UA >50 (H) 0 - 5 WBC/hpf   Bacteria, UA RARE (A) NONE SEEN   Squamous Epithelial / LPF 6-10 0 - 5   Mucus PRESENT    Budding Yeast PRESENT    Hyaline Casts, UA PRESENT    Uric Acid Crys, UA PRESENT   Comprehensive metabolic panel  Result Value Ref Range   Sodium 142 135 - 145 mmol/L   Potassium 3.6 3.5 - 5.1 mmol/L   Chloride 111 98 - 111 mmol/L   CO2 24 22 - 32 mmol/L   Glucose, Bld 90 70 - 99 mg/dL   BUN 9 8 - 23 mg/dL   Creatinine, Ser 0.64 0.44 - 1.00 mg/dL   Calcium 8.7 (L) 8.9 - 10.3 mg/dL   Total Protein 6.5 6.5 - 8.1 g/dL   Albumin 3.7 3.5 - 5.0 g/dL   AST 34 15 - 41 U/L   ALT 17 0 - 44 U/L   Alkaline Phosphatase 55 38 - 126 U/L   Total Bilirubin 0.6 0.3 - 1.2 mg/dL   GFR calc non Af Amer >60 >60 mL/min   GFR calc Af Amer >60 >60 mL/min   Anion gap 7 5 - 15  CBC with Differential  Result Value Ref Range   WBC 10.9 (H) 4.0 - 10.5 K/uL   RBC 3.89  3.87 - 5.11 MIL/uL   Hemoglobin 11.3 (L) 12.0 - 15.0 g/dL   HCT 36.0 36.0 - 46.0 %   MCV 92.5 80.0 - 100.0 fL   MCH 29.0 26.0 - 34.0 pg   MCHC 31.4 30.0 - 36.0 g/dL   RDW 14.7 11.5 - 15.5 %   Platelets 222 150 - 400 K/uL   nRBC 0.0 0.0 - 0.2 %   Neutrophils Relative % 82 %   Neutro Abs 8.9 (H) 1.7 -  7.7 K/uL   Lymphocytes Relative 8 %   Lymphs Abs 0.8 0.7 - 4.0 K/uL   Monocytes Relative 9 %   Monocytes Absolute 1.0 0.1 - 1.0 K/uL   Eosinophils Relative 0 %   Eosinophils Absolute 0.0 0.0 - 0.5 K/uL   Basophils Relative 0 %   Basophils Absolute 0.0 0.0 - 0.1 K/uL   Immature Granulocytes 1 %   Abs Immature Granulocytes 0.09 (H) 0.00 - 0.07 K/uL  CBG monitoring, ED  Result Value Ref Range   Glucose-Capillary 67 (L) 70 - 99 mg/dL   Dg Chest Port 1 View  Result Date: 10/29/2017 CLINICAL DATA:  Shortness of breath EXAM: PORTABLE CHEST 1 VIEW COMPARISON:  None. FINDINGS: There is no edema or consolidation. Heart is borderline enlarged with pulmonary vascularity normal. No adenopathy. There is upper thoracic levoscoliosis. IMPRESSION: Heart borderline enlarged.  No edema or consolidation. Electronically Signed   By: Lowella Grip III M.D.   On: 10/29/2017 08:28   Initial Impression / Assessment and Plan / ED Course  I have reviewed the triage vital signs and the nursing notes.  Pertinent labs & imaging results that were available during my care of the patient were reviewed by me and considered in my medical decision making (see chart for details).     Chest xray viewed by me. 8 30 a.m. patient's husband arrived and she looks like her normal self.  At 9:30 AM she is alert, moves all extremities.  Her husband continues to say that she looks at her baseline. Possibility of seizure exists though uncertain.  I explained this to her husband.  He will need to observe her at home.  This all occurred while she was asleep.  He states that he has adequate help at home to manage her.  She is  supervised around-the-clock.  He is requesting a mild sedative for agitation. I will write prescription for Ativan 0.5 mg twice daily as needed agitation dispense 10 tablets with no refills.  Follow-up with PMD had lab work consistent with mild anemia otherwise normal. [place] Controlled Substance reporting System queried. Urine sent for culture Final Clinical Impressions(s) / ED Diagnoses  Dx dyspnea Final diagnoses:  None    ED Discharge Orders    None       Orlie Dakin, MD 10/29/17 [phone]

## 2017-11-01 LAB — URINE CULTURE
Culture: >=100000 — AB
Special Requests: NORMAL

## 2017-11-02 ENCOUNTER — Telehealth: Payer: Self-pay | Admitting: Emergency Medicine

## 2017-11-02 NOTE — Progress Notes (Signed)
ED Antimicrobial Stewardship Positive Culture Follow Up   Jasmine [name] is an 62 y.o. female who presented to [facility] on 10/29/2017 with a chief complaint of  Chief Complaint  Patient presents with  . Shortness of Breath  . Altered Mental Status    Recent Results (from the past 720 hour(s))  Urine Culture     Status: Abnormal   Collection Time: 10/29/17  7:00 AM  Result Value Ref Range Status   Specimen Description   Final    URINE, CATHETERIZED Performed at [facility], [address], [place], [place] [address]    Special Requests   Final    Normal Performed at [facility], [address], [place], [place] [address]    Culture >=100,000 COLONIES/mL ESCHERICHIA COLI (A)  Final   Report Status 11/01/2017 FINAL  Final   Organism ID, Bacteria ESCHERICHIA COLI (A)  Final      Susceptibility   Escherichia coli - MIC*    AMPICILLIN 4 SENSITIVE Sensitive     CEFAZOLIN <=4 SENSITIVE Sensitive     CEFTRIAXONE <=1 SENSITIVE Sensitive     CIPROFLOXACIN <=0.25 SENSITIVE Sensitive     GENTAMICIN <=1 SENSITIVE Sensitive     IMIPENEM <=0.25 SENSITIVE Sensitive     NITROFURANTOIN 64 INTERMEDIATE Intermediate     TRIMETH/SULFA <=20 SENSITIVE Sensitive     AMPICILLIN/SULBACTAM <=2 SENSITIVE Sensitive     PIP/TAZO <=4 SENSITIVE Sensitive     Extended ESBL NEGATIVE Sensitive     * >=100,000 COLONIES/mL ESCHERICHIA COLI    [x]  Patient discharged originally without antimicrobial agent and treatment is now indicated  New antibiotic prescription: Start Keflex 500mg  PO Q12h x 5 days  ED Provider: Maury Dus, PA-C   Elicia Lamp P 11/02/2017, 9:02 AM Clinical Pharmacist Monday - Friday phone -  [phone] Saturday - Sunday phone - [phone]

## 2017-11-02 NOTE — Telephone Encounter (Signed)
Post ED Visit - Positive Culture Follow-up: Successful Patient Follow-Up  Culture assessed and recommendations reviewed by:  []  Elenor Quinones, Pharm.D. []  Heide Guile, Pharm.D., BCPS AQ-ID []  Parks Neptune, Pharm.D., BCPS []  Alycia Rossetti, Pharm.D., BCPS []  [place], [place].D., BCPS, AAHIVP []  Legrand Como, Pharm.D., BCPS, AAHIVP []  Salome Arnt, PharmD, BCPS []  Johnnette Gourd, PharmD, BCPS []  Hughes Better, PharmD, BCPS []  Leeroy Cha, PharmD Elicia Lamp  PharmD  Positive urine culture  [x]  Patient discharged without antimicrobial prescription and treatment is now indicated []  Organism is resistant to prescribed ED discharge antimicrobial []  Patient with positive blood cultures  Changes discussed with ED provider: Maury Dus PA New antibiotic prescription start Keflex 500mg  po q 12 hours x 5 days  Attempting to contact patient   Hazle Nordmann 11/02/2017, 10:13 AM

## 2017-11-14 ENCOUNTER — Other Ambulatory Visit: Payer: Self-pay | Admitting: Family Medicine

## 2017-11-15 NOTE — Telephone Encounter (Signed)
Last seen 11/23/17

## 2017-11-22 ENCOUNTER — Telehealth: Payer: Self-pay | Admitting: Emergency Medicine

## 2017-11-22 NOTE — Telephone Encounter (Signed)
Lost to followup 

## 2018-01-04 ENCOUNTER — Encounter: Payer: Self-pay | Admitting: Nurse Practitioner

## 2018-01-04 ENCOUNTER — Ambulatory Visit: Payer: BLUE CROSS/BLUE SHIELD | Admitting: Nurse Practitioner

## 2018-01-04 VITALS — Ht 63.0 in | Wt 128.0 lb

## 2018-01-04 DIAGNOSIS — R569 Unspecified convulsions: Secondary | ICD-10-CM | POA: Diagnosis not present

## 2018-01-04 DIAGNOSIS — F0280 Dementia in other diseases classified elsewhere without behavioral disturbance: Secondary | ICD-10-CM | POA: Diagnosis not present

## 2018-01-04 DIAGNOSIS — G309 Alzheimer's disease, unspecified: Secondary | ICD-10-CM | POA: Diagnosis not present

## 2018-01-04 MED ORDER — LORAZEPAM 1 MG PO TABS: 0.5000 mg | ORAL_TABLET | Freq: Two times a day (BID) | ORAL | 1 refills | Status: DC | PRN

## 2018-01-04 NOTE — Progress Notes (Signed)
Subjective:    Patient ID: Jasmine [name], female    DOB: 02-Feb-1955, 62 y.o.   MRN: [id number]   Chief Complaint: needs forms filled out.  HPI Patient is brought in by her husband and Education officer, [employer]. She has been having multiple seizures that has caused some issues. She stays confused and uncooperative.  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 They are in the need to place her some where because they are uable to take care of her anymore. She has seen a neurologist previously and they said there was not much they could do for her. It has been several years since she has seen neurologist. Needs fml2 papers filled out.  Review of Systems  Constitutional: Negative for activity change and appetite change.  HENT: Negative.   Eyes: Negative for pain.  Respiratory: Negative for shortness of breath.   Cardiovascular: Negative for chest pain, palpitations and leg swelling.  Gastrointestinal: Negative for abdominal pain.  Endocrine: Negative for polydipsia.  Genitourinary: Negative.   Skin: Negative for rash.  Neurological: Negative for dizziness, weakness and headaches.  Hematological: Does not bruise/bleed easily.  Psychiatric/Behavioral: Positive for confusion.  All other systems reviewed and are negative.      Objective:   Physical Exam Vitals signs and nursing note reviewed.  Constitutional:      General: She is not in acute distress.    Appearance: Normal appearance. She is well-developed and normal weight.  HENT:     Head: Normocephalic.     Nose: Nose normal.  Eyes:     Pupils: Pupils are equal, round, and reactive to light.  Neck:     Musculoskeletal: Normal range of motion and neck supple.     Vascular: No carotid bruit or JVD.  Cardiovascular:     Rate and Rhythm: Normal rate and regular rhythm.     Heart sounds: Normal heart sounds.  Pulmonary:     Effort: Pulmonary effort is normal. No respiratory distress.     Breath sounds: Normal breath sounds. No wheezing or  rales.  Chest:     Chest wall: No tenderness.  Abdominal:     General: Bowel sounds are normal. There is no distension or abdominal bruit.     Palpations: Abdomen is soft. There is no hepatomegaly, splenomegaly, mass or pulsatile mass.     Tenderness: There is no abdominal tenderness.  Musculoskeletal: Normal range of motion.     Comments: Gait slow and steady  Lymphadenopathy:     Cervical: No cervical adenopathy.  Skin:    General: Skin is warm and dry.  Neurological:     Mental Status: She is alert.     Deep Tendon Reflexes: Reflexes are normal and symmetric.     Comments: Complete disoriented Walking around exam room mumbling  Cannot understand what she is saying Unable to answers any questions  Psychiatric:        Behavior: Behavior normal.        Thought Content: Thought content normal.        Judgment: Judgment normal.    Ht 5\' 3"  (1.6 m)   Wt 128 lb (58.1 kg)   BMI 22.67 kg/m  Unable to get blood pressure.  Difficult to assess because will not sit on exam table.       Assessment & Plan:  Jasmine [name] in today with chief complaint of No chief complaint on file.   1. Seizure-like activity ([place]) Will do referral t neurology - Ambulatory referral to Neurology  2. Alzheimer's dementia without behavioral disturbance, unspecified timing of dementia onset ([place]) Will get FMLA 2 forms filled out Do not leave her alone  RTO prn  Mary-Margaret Hassell Done, FNP

## 2018-01-04 NOTE — Patient Instructions (Signed)
Dementia Caregiver Guide Dementia is a term used to describe a number of symptoms that affect memory and thinking. The most common symptoms include:  Memory loss.  Trouble with language and communication.  Trouble concentrating.  Poor judgment.  Problems with reasoning.  Child-like behavior and language.  Extreme anxiety.  Angry outbursts.  Wandering from home or public places.  Dementia usually gets worse slowly over time. In the early stages, people with dementia can stay independent and safe with some help. In later stages, they need help with daily tasks such as dressing, grooming, and using the bathroom. How to help the person with dementia cope Dementia can be frightening and confusing. Here are some tips to help the person with dementia cope with changes caused by the disease. General tips  Keep the person on track with his or her routine.  Try to identify areas where the person may need help.  Be supportive, patient, calm, and encouraging.  Gently remind the person that adjusting to changes takes time.  Help with the tasks that the person has asked for help with.  Keep the person involved in daily tasks and decisions as much as possible.  Encourage conversation, but try not to get frustrated or harried if the person struggles to find words or does not seem to appreciate your help. Communication tips  When the person is talking or seems frustrated, make eye contact and hold the person's hand.  Ask specific questions that need yes or no answers.  Use simple words, short sentences, and a calm voice. Only give one direction at a time.  When offering choices, limit them to just 1 or 2.  Avoid correcting the person in a negative way.  If the person is struggling to find the right words, gently try to help him or her. How to recognize symptoms of stress Symptoms of stress in caregivers include:  Feeling frustrated or angry with the person with  dementia.  Denying that the person has dementia or that his or her symptoms will not improve.  Feeling hopeless and unappreciated.  Difficulty sleeping.  Difficulty concentrating.  Feeling anxious, irritable, or depressed.  Developing stress-related health problems.  Feeling like you have too little time for your own life.  Follow these instructions at home:  Make sure that you and the person you are caring for: ? Get regular sleep. ? Exercise regularly. ? Eat regular, nutritious meals. ? Drink enough fluid to keep your urine clear or pale yellow. ? Take over-the-counter and prescription medicines only as told by your health care providers. ? Attend all scheduled health care appointments.  Join a support group with others who are caregivers.  Ask about respite care resources so that you can have a regular break from the stress of caregiving.  Look for signs of stress in yourself and in the person you are caring for. If you notice signs of stress, take steps to manage it.  Consider any safety risks and take steps to avoid them.  Organize medications in a pill box for each day of the week.  Create a plan to handle any legal or financial matters. Get legal or financial advice if needed.  Keep a calendar in a central location to remind the person of appointments or other activities. Tips for reducing the risk of injury  Keep floors clear of clutter. Remove rugs, magazine racks, and floor lamps.  Keep hallways well lit, especially at night.  Put a handrail and nonslip mat in the bathtub   or shower.  Put childproof locks on cabinets that contain dangerous items, such as medicines, alcohol, guns, toxic cleaning items, sharp tools or utensils, matches, and lighters.  Put the locks in places where the person cannot see or reach them easily. This will help ensure that the person does not wander out of the house and get lost.  Be prepared for emergencies. Keep a list of  emergency phone numbers and addresses in a convenient area.  Remove car keys and lock garage doors so that the person does not try to get in the car and drive.  Have the person wear a bracelet that tracks locations and identifies the person as having memory problems. This should be worn at all times for safety. Where to find support: Many individuals and organizations offer support. These include:  Support groups for people with dementia and for caregivers.  Counselors or therapists.  Home health care services.  Adult day care centers.  Where to find more information: Alzheimer's Association: www.alz.org Contact a health care provider if:  The person's health is rapidly getting worse.  You are no longer able to care for the person.  Caring for the person is affecting your physical and emotional health.  The person threatens himself or herself, you, or anyone else. Summary  Dementia is a term used to describe a number of symptoms that affect memory and thinking.  Dementia usually gets worse slowly over time.  Take steps to reduce the person's risk of injury, and to plan for future care.  Caregivers need support, relief from caregiving, and time for their own lives. This information is not intended to replace advice given to you by your health care provider. Make sure you discuss any questions you have with your health care provider. Document Released: 12/10/2015 Document Revised: 12/10/2015 Document Reviewed: 12/10/2015 Elsevier Interactive Patient Education  2018 [employer]  

## 2018-01-04 NOTE — Addendum Note (Signed)
Addended by: Chevis Pretty on: 01/04/2018 08:54 AM   Modules accepted: Orders

## 2018-03-09 ENCOUNTER — Encounter: Payer: Self-pay | Admitting: Diagnostic Neuroimaging

## 2018-03-09 ENCOUNTER — Ambulatory Visit (INDEPENDENT_AMBULATORY_CARE_PROVIDER_SITE_OTHER): Payer: BLUE CROSS/BLUE SHIELD | Admitting: Diagnostic Neuroimaging

## 2018-03-09 VITALS — BP 123/79 | HR 62 | Ht 63.0 in | Wt 123.4 lb

## 2018-03-09 DIAGNOSIS — F0390 Unspecified dementia without behavioral disturbance: Secondary | ICD-10-CM | POA: Diagnosis not present

## 2018-03-09 DIAGNOSIS — F03C0 Unspecified dementia, severe, without behavioral disturbance, psychotic disturbance, mood disturbance, and anxiety: Secondary | ICD-10-CM

## 2018-03-09 MED ORDER — DIVALPROEX SODIUM 500 MG PO DR TAB: 500.0000 mg | DELAYED_RELEASE_TABLET | Freq: Two times a day (BID) | ORAL | 12 refills | Status: AC

## 2018-03-09 NOTE — Progress Notes (Signed)
[facility]  PATIENT: Jasmine [name] DOB: 1955/08/21  REFERRING CLINICIAN: 03/09/18 HISTORY FROM: patient's husband  REASON FOR VISIT: new consult    HISTORICAL  CHIEF COMPLAINT:  Chief Complaint  Patient presents with  . Seizure-like activity    rm 6,  New Pt , husband- Warren Lacy, "no seizure activity since Oct 2019, loud outbursts    HISTORY OF PRESENT ILLNESS:   63 year old female here for evaluation of seizure and dementia.  Patient is accompanied by husband.  Patient is moderate severely demented demented, speaking tangentially, standing and walking around the room.  At times she is able to make eye contact and follow commands.  Patient had onset of memory loss, confusion, behavior changes around 2016.  She went to [place] with her daughter to get evaluation.  She was diagnosed with dementia and then returned back to [place] to be with her husband.  Symptoms have progressively worsened since that time.  Symptoms are quite advanced in the last 6 to 12 months.  She needs 24-hour supervision and help with most of her ADLs.  In October 2019 patient had a seizure at home with heavy breathing, convulsions, stiffness, confusion.  Patient went to emergency room for evaluation.  2 weeks later at home she had 2 more seizures.  She has not been started on antiseizure medication.  Patient has tried donepezil for dementia but family has not noticed any change.    REVIEW OF SYSTEMS: Full 14 system review of systems performed and negative with exception of: Memory loss confusion seizure depression anxiety suicidal thoughts hallucinations weight loss blurred vision.   ALLERGIES: Allergies  Allergen Reactions  . Shellfish Allergy   . Sulfa Antibiotics   . Lexapro [Escitalopram Oxalate] Rash    HOME MEDICATIONS: Outpatient Medications Prior to Visit  Medication Sig Dispense Refill  . donepezil (ARICEPT) 10 MG tablet TAKE 1 TABLET (10 MG TOTAL) BY MOUTH AT  BEDTIME. (NEEDS TO BE SEEN BEFORE NEXT REFILL) 30 tablet 0  . LORazepam (ATIVAN) 1 MG tablet Take 0.5 tablets (0.5 mg total) by mouth 2 (two) times daily as needed for anxiety. 60 tablet 1  . OVER THE COUNTER MEDICATION ginko biloba, melatonin, B12, vit C, phosphatidyl choline, OTC menopause support, and CEBRIA    . mirtazapine (REMERON) 30 MG tablet TAKE 1 TABLET (30 MG TOTAL) BY MOUTH AT BEDTIME. (Patient not taking: Reported on 03/09/2018) 90 tablet 0  . tacrolimus (PROTOPIC) 0.03 % ointment Apply topically 2 (two) times daily. (Patient not taking: Reported on 03/09/2018) 60 g 5   No facility-administered medications prior to visit.     PAST MEDICAL HISTORY: Past Medical History:  Diagnosis Date  . Anxiety   . Arm fracture    right, steel pin  . Dementia ([place])    dx 2018  . Depression     PAST SURGICAL HISTORY: Past Surgical History:  Procedure Laterality Date  . arm surgery Right    Fx, steel pin   . BACK SURGERY     "burning of nerve due to constant back pain 2002  . BREAST ENHANCEMENT SURGERY     removed 2017  . UTERINE FIBROID SURGERY  2017    FAMILY HISTORY: No family history on file.  SOCIAL HISTORY: Social History   Socioeconomic History  . Marital status: Married    Spouse name: Warren Lacy  . Number of children: 3  . Years of education: Not on file  . 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 Financial resource strain: Not on file  . Food insecurity:    Worry: Not on file    Inability: Not on file  . Transportation needs:    Medical: Not on file    Non-medical: Not on file  Tobacco Use  . Smoking status: Never Smoker  . Smokeless tobacco: Never Used  Substance and Sexual Activity  . Alcohol use: Yes    Comment: social, 1/2 glass  . Drug use: No  . Sexual activity: Not on file  Lifestyle  . Physical activity:    Days per week: Not on file    Minutes per  session: Not on file  . Stress: Not on file  Relationships  . Social connections:    Talks on phone: Not on file    Gets together: Not on file    Attends religious service: Not on file    Active member of club or organization: Not on file    Attends meetings of clubs or organizations: Not on file    Relationship status: Not on file  . Intimate partner violence:    Fear of current or ex partner: Not on file    Emotionally abused: Not on file    Physically abused: Not on file    Forced sexual activity: Not on file  Other Topics Concern  . Not on file  Social History Narrative   Lives with husband, son   Caffeine- 1 cup daily     PHYSICAL EXAM  GENERAL EXAM/CONSTITUTIONAL: Vitals:  Vitals:   03/09/18 1024  BP: 123/79  Pulse: 62  Weight: 123 lb 6.4 oz (56 kg)  Height: 5\' 3"  (1.6 m)     Body mass index is 21.86 kg/m. Wt Readings from Last 3 Encounters:  03/09/18 123 lb 6.4 oz (56 kg)  01/04/18 128 lb (58.1 kg)  10/29/17 160 lb 15 oz (73 kg)     Patient is in no distress; well developed, nourished and groomed; neck is supple  CARDIOVASCULAR:  Examination of carotid arteries is normal; no carotid bruits  Regular rate and rhythm, no murmurs  Examination of peripheral vascular system by observation and palpation is normal  EYES:  Ophthalmoscopic exam of optic discs and posterior segments is normal; no papilledema or hemorrhages  Vision Screening Comments: unable  MUSCULOSKELETAL:  Gait, strength, tone, movements noted in Neurologic exam below  NEUROLOGIC: MENTAL STATUS:  No flowsheet data found.  awake, alert, oriented to person  [facility] memory   DECR attention and concentration  TANGENTIAL THOUGHTS language fluent, comprehension intact, naming intact  fund of knowledge appropriate  CRANIAL NERVE:   2nd - no papilledema on fundoscopic exam  2nd, 3rd, 4th, 6th - pupils equal and reactive to light, visual fields full to confrontation, extraocular  muscles intact, no nystagmus  5th - facial sensation symmetric  7th - facial strength symmetric  8th - hearing intact  9th - palate elevates symmetrically, uvula midline  11th - shoulder shrug symmetric  12th - tongue protrusion midline  MOTOR:   normal bulk and tone, full strength in the BUE, BLE  SENSORY:   normal and symmetric to light touch  COORDINATION:   finger-nose-finger, fine finger movements SLOW  REFLEXES:   deep tendon reflexes TRACE and symmetric  GAIT/STATION:   narrow based gait     DIAGNOSTIC DATA (LABS, IMAGING, TESTING) - I reviewed patient records, labs, notes, testing and imaging myself where available.  Lab Results  Component Value Date   WBC 10.9 (H) 10/29/2017   HGB 11.3 (L) 10/29/2017   HCT 36.0 10/29/2017   MCV 92.5 10/29/2017   PLT 222 10/29/2017      Component Value Date/Time   NA 142 10/29/2017 0857   NA 144 12/18/2016 1714   K 3.6 10/29/2017 0857   CL 111 10/29/2017 0857   CO2 24 10/29/2017 0857   GLUCOSE 90 10/29/2017 0857   BUN 9 10/29/2017 0857   BUN 9 12/18/2016 1714   CREATININE 0.64 10/29/2017 0857   CALCIUM 8.7 (L) 10/29/2017 0857   PROT 6.5 10/29/2017 0857   PROT 7.2 12/18/2016 1714   ALBUMIN 3.7 10/29/2017 0857   ALBUMIN 4.8 12/18/2016 1714   AST 34 10/29/2017 0857   ALT 17 10/29/2017 0857   ALKPHOS 55 10/29/2017 0857   BILITOT 0.6 10/29/2017 0857   BILITOT 0.5 12/18/2016 1714   GFRNONAA >60 10/29/2017 0857   GFRAA >60 10/29/2017 0857   Lab Results  Component Value Date   CHOL 190 12/18/2016   HDL 96 12/18/2016   LDLCALC 86 12/18/2016   TRIG 38 12/18/2016   CHOLHDL 2.0 12/18/2016   No results found for: HGBA1C Lab Results  Component Value Date   VITAMINB12 489 04/16/2015   Lab Results  Component Value Date   TSH 2.410 12/18/2016    05/29/16 MRI brain (with and without) [I reviewed images myself. -[facility]]  - mild chronic small vessel ischemic disease; mild atrophy; no acute  findings    ASSESSMENT AND PLAN  63 y.o. year old female here with severe dementia starting in 2016, now with seizures likely related to underlying neurodegenerative process.  Dx:  1. Severe dementia ([place])     PLAN:  SEVERE DEMENTIA WITH BEHAVIORAL DISTURBANCE + SEIZURE - start divalproex 500mg  twice a day for seizure prevention and mood stabilization - safety / supervision issues reviewed - caregiver resources provided; patient needs 24 hour supervision - stop donepezil (not effective; advanced dementia)  Meds ordered this encounter  Medications  . divalproex (DEPAKOTE) 500 MG DR tablet    Sig: Take 1 tablet (500 mg total) by mouth 2 (two) times daily.    Dispense:  60 tablet    Refill:  12   Return in about 6 months (around 09/07/2018).    Penni Bombard, MD 02/28/3126, 11:88 AM Certified in Neurology, Neurophysiology and Neuroimaging  [facility] [address], [place], [place] [address] [phone]

## 2018-03-09 NOTE — Patient Instructions (Signed)
SEVERE DEMENTIA WITH BEHAVIORAL DISTURBANCE + SEIZURE - start divalproex 500mg  twice a day  - safety / supervision issues reviewed - caregiver resources provided; patient needs 24 hour supervision - stop donepezil

## 2018-03-13 ENCOUNTER — Other Ambulatory Visit: Payer: Self-pay | Admitting: Nurse Practitioner

## 2018-09-05 ENCOUNTER — Telehealth: Payer: Self-pay | Admitting: *Deleted

## 2018-09-05 ENCOUNTER — Ambulatory Visit: Payer: BLUE CROSS/BLUE SHIELD | Admitting: Diagnostic Neuroimaging

## 2018-09-05 NOTE — Telephone Encounter (Signed)
Patient was no show for follow up today. 

## 2018-09-06 ENCOUNTER — Encounter: Payer: Self-pay | Admitting: Diagnostic Neuroimaging

## 2018-12-30 ENCOUNTER — Encounter (HOSPITAL_COMMUNITY): Payer: Self-pay | Admitting: *Deleted

## 2018-12-30 ENCOUNTER — Other Ambulatory Visit: Payer: Self-pay

## 2018-12-30 ENCOUNTER — Emergency Department (HOSPITAL_COMMUNITY): Payer: BLUE CROSS/BLUE SHIELD

## 2018-12-30 ENCOUNTER — Inpatient Hospital Stay (HOSPITAL_COMMUNITY)
Admission: EM | Admit: 2018-12-30 | Discharge: 2019-02-20 | DRG: 853 | Disposition: E | Payer: BLUE CROSS/BLUE SHIELD | Attending: Neurological Surgery | Admitting: Neurological Surgery

## 2018-12-30 DIAGNOSIS — G8190 Hemiplegia, unspecified affecting unspecified side: Secondary | ICD-10-CM | POA: Diagnosis present

## 2018-12-30 DIAGNOSIS — J9601 Acute respiratory failure with hypoxia: Secondary | ICD-10-CM

## 2018-12-30 DIAGNOSIS — G911 Obstructive hydrocephalus: Secondary | ICD-10-CM | POA: Diagnosis present

## 2018-12-30 DIAGNOSIS — L89896 Pressure-induced deep tissue damage of other site: Secondary | ICD-10-CM | POA: Diagnosis present

## 2018-12-30 DIAGNOSIS — Z9289 Personal history of other medical treatment: Secondary | ICD-10-CM

## 2018-12-30 DIAGNOSIS — Z66 Do not resuscitate: Secondary | ICD-10-CM | POA: Diagnosis not present

## 2018-12-30 DIAGNOSIS — H4911 Fourth [trochlear] nerve palsy, right eye: Secondary | ICD-10-CM | POA: Diagnosis present

## 2018-12-30 DIAGNOSIS — Z515 Encounter for palliative care: Secondary | ICD-10-CM | POA: Diagnosis not present

## 2018-12-30 DIAGNOSIS — C710 Malignant neoplasm of cerebrum, except lobes and ventricles: Secondary | ICD-10-CM | POA: Diagnosis present

## 2018-12-30 DIAGNOSIS — A419 Sepsis, unspecified organism: Principal | ICD-10-CM | POA: Diagnosis present

## 2018-12-30 DIAGNOSIS — E876 Hypokalemia: Secondary | ICD-10-CM | POA: Diagnosis not present

## 2018-12-30 DIAGNOSIS — G40909 Epilepsy, unspecified, not intractable, without status epilepticus: Secondary | ICD-10-CM | POA: Diagnosis present

## 2018-12-30 DIAGNOSIS — J189 Pneumonia, unspecified organism: Secondary | ICD-10-CM | POA: Diagnosis present

## 2018-12-30 DIAGNOSIS — Z888 Allergy status to other drugs, medicaments and biological substances status: Secondary | ICD-10-CM

## 2018-12-30 DIAGNOSIS — F0390 Unspecified dementia without behavioral disturbance: Secondary | ICD-10-CM | POA: Diagnosis present

## 2018-12-30 DIAGNOSIS — Z7189 Other specified counseling: Secondary | ICD-10-CM | POA: Diagnosis not present

## 2018-12-30 DIAGNOSIS — G8194 Hemiplegia, unspecified affecting left nondominant side: Secondary | ICD-10-CM | POA: Diagnosis present

## 2018-12-30 DIAGNOSIS — D638 Anemia in other chronic diseases classified elsewhere: Secondary | ICD-10-CM | POA: Diagnosis present

## 2018-12-30 DIAGNOSIS — I615 Nontraumatic intracerebral hemorrhage, intraventricular: Secondary | ICD-10-CM | POA: Diagnosis not present

## 2018-12-30 DIAGNOSIS — L89153 Pressure ulcer of sacral region, stage 3: Secondary | ICD-10-CM | POA: Diagnosis present

## 2018-12-30 DIAGNOSIS — J95821 Acute postprocedural respiratory failure: Secondary | ICD-10-CM | POA: Diagnosis not present

## 2018-12-30 DIAGNOSIS — G9340 Encephalopathy, unspecified: Secondary | ICD-10-CM | POA: Diagnosis not present

## 2018-12-30 DIAGNOSIS — Z20822 Contact with and (suspected) exposure to covid-19: Secondary | ICD-10-CM | POA: Diagnosis not present

## 2018-12-30 DIAGNOSIS — Z20828 Contact with and (suspected) exposure to other viral communicable diseases: Secondary | ICD-10-CM | POA: Diagnosis present

## 2018-12-30 DIAGNOSIS — L89309 Pressure ulcer of unspecified buttock, unspecified stage: Secondary | ICD-10-CM | POA: Diagnosis not present

## 2018-12-30 DIAGNOSIS — I619 Nontraumatic intracerebral hemorrhage, unspecified: Secondary | ICD-10-CM | POA: Diagnosis present

## 2018-12-30 DIAGNOSIS — Z91013 Allergy to seafood: Secondary | ICD-10-CM

## 2018-12-30 DIAGNOSIS — Z7289 Other problems related to lifestyle: Secondary | ICD-10-CM

## 2018-12-30 DIAGNOSIS — Z681 Body mass index (BMI) 19 or less, adult: Secondary | ICD-10-CM | POA: Diagnosis not present

## 2018-12-30 DIAGNOSIS — G9349 Other encephalopathy: Secondary | ICD-10-CM | POA: Diagnosis present

## 2018-12-30 DIAGNOSIS — I613 Nontraumatic intracerebral hemorrhage in brain stem: Secondary | ICD-10-CM | POA: Diagnosis present

## 2018-12-30 DIAGNOSIS — E43 Unspecified severe protein-calorie malnutrition: Secondary | ICD-10-CM | POA: Diagnosis present

## 2018-12-30 DIAGNOSIS — R6521 Severe sepsis with septic shock: Secondary | ICD-10-CM | POA: Diagnosis not present

## 2018-12-30 DIAGNOSIS — Z7401 Bed confinement status: Secondary | ICD-10-CM

## 2018-12-30 DIAGNOSIS — J9690 Respiratory failure, unspecified, unspecified whether with hypoxia or hypercapnia: Secondary | ICD-10-CM

## 2018-12-30 DIAGNOSIS — D496 Neoplasm of unspecified behavior of brain: Secondary | ICD-10-CM | POA: Diagnosis not present

## 2018-12-30 DIAGNOSIS — J9600 Acute respiratory failure, unspecified whether with hypoxia or hypercapnia: Secondary | ICD-10-CM | POA: Diagnosis not present

## 2018-12-30 DIAGNOSIS — L8990 Pressure ulcer of unspecified site, unspecified stage: Secondary | ICD-10-CM | POA: Insufficient documentation

## 2018-12-30 DIAGNOSIS — Z9911 Dependence on respirator [ventilator] status: Secondary | ICD-10-CM | POA: Diagnosis not present

## 2018-12-30 DIAGNOSIS — Z882 Allergy status to sulfonamides status: Secondary | ICD-10-CM

## 2018-12-30 DIAGNOSIS — I610 Nontraumatic intracerebral hemorrhage in hemisphere, subcortical: Secondary | ICD-10-CM | POA: Diagnosis not present

## 2018-12-30 DIAGNOSIS — F329 Major depressive disorder, single episode, unspecified: Secondary | ICD-10-CM | POA: Diagnosis present

## 2018-12-30 DIAGNOSIS — F419 Anxiety disorder, unspecified: Secondary | ICD-10-CM | POA: Diagnosis present

## 2018-12-30 LAB — I-STAT CHEM 8, ED
BUN: 14 mg/dL (ref 8–23)
Calcium, Ion: 1.2 mmol/L (ref 1.15–1.40)
Chloride: 103 mmol/L (ref 98–111)
Creatinine, Ser: 0.5 mg/dL (ref 0.44–1.00)
Glucose, Bld: 129 mg/dL — ABNORMAL HIGH (ref 70–99)
HCT: 42 % (ref 36.0–46.0)
Hemoglobin: 14.3 g/dL (ref 12.0–15.0)
Potassium: 4.4 mmol/L (ref 3.5–5.1)
Sodium: 139 mmol/L (ref 135–145)
TCO2: 27 mmol/L (ref 22–32)

## 2018-12-30 LAB — RESPIRATORY PANEL BY RT PCR (FLU A&B, COVID)
Influenza A by PCR: NEGATIVE
Influenza B by PCR: NEGATIVE
SARS Coronavirus 2 by RT PCR: NEGATIVE

## 2018-12-30 LAB — COMPREHENSIVE METABOLIC PANEL
ALT: 27 U/L (ref 0–44)
AST: 35 U/L (ref 15–41)
Albumin: 3 g/dL — ABNORMAL LOW (ref 3.5–5.0)
Alkaline Phosphatase: 81 U/L (ref 38–126)
Anion gap: 10 (ref 5–15)
BUN: 15 mg/dL (ref 8–23)
CO2: 26 mmol/L (ref 22–32)
Calcium: 9.1 mg/dL (ref 8.9–10.3)
Chloride: 103 mmol/L (ref 98–111)
Creatinine, Ser: 0.45 mg/dL (ref 0.44–1.00)
GFR calc Af Amer: >60 mL/min (ref >60)
GFR calc non Af Amer: >60 mL/min (ref >60)
Glucose, Bld: 136 mg/dL — ABNORMAL HIGH (ref 70–99)
Potassium: 4.4 mmol/L (ref 3.5–5.1)
Sodium: 139 mmol/L (ref 135–145)
Total Bilirubin: 0.5 mg/dL (ref 0.3–1.2)
Total Protein: 7.1 g/dL (ref 6.5–8.1)

## 2018-12-30 LAB — CBC WITH DIFFERENTIAL/PLATELET
Abs Immature Granulocytes: 0.04 10*3/uL (ref 0.00–0.07)
Basophils Absolute: 0.1 10*3/uL (ref 0.0–0.1)
Basophils Relative: 0 %
Eosinophils Absolute: 0.6 10*3/uL — ABNORMAL HIGH (ref 0.0–0.5)
Eosinophils Relative: 4 %
HCT: 41.2 % (ref 36.0–46.0)
Hemoglobin: 13.4 g/dL (ref 12.0–15.0)
Immature Granulocytes: 0 %
Lymphocytes Relative: 8 %
Lymphs Abs: 1.1 10*3/uL (ref 0.7–4.0)
MCH: 31.8 pg (ref 26.0–34.0)
MCHC: 32.5 g/dL (ref 30.0–36.0)
MCV: 97.9 fL (ref 80.0–100.0)
Monocytes Absolute: 1.5 10*3/uL — ABNORMAL HIGH (ref 0.1–1.0)
Monocytes Relative: 10 %
Neutro Abs: 11.8 10*3/uL — ABNORMAL HIGH (ref 1.7–7.7)
Neutrophils Relative %: 78 %
Platelets: 354 10*3/uL (ref 150–400)
RBC: 4.21 MIL/uL (ref 3.87–5.11)
RDW: 13.3 % (ref 11.5–15.5)
WBC: 15 10*3/uL — ABNORMAL HIGH (ref 4.0–10.5)
nRBC: 0 % (ref 0.0–0.2)

## 2018-12-30 LAB — BLOOD GAS, ARTERIAL
Acid-Base Excess: 2.3 mmol/L — ABNORMAL HIGH (ref 0.0–2.0)
FIO2: 100
MECHVT: 470 mL
O2 Saturation: >100 %
PEEP: 5 cmH2O
Patient temperature: 33.9
RATE: 14 resp/min
pCO2 arterial: 27.5 mmHg — ABNORMAL LOW (ref 32.0–48.0)
pH, Arterial: 7.561 — ABNORMAL HIGH (ref 7.350–7.450)
pO2, Arterial: 362 mmHg — ABNORMAL HIGH (ref 83.0–108.0)

## 2018-12-30 LAB — URINALYSIS, ROUTINE W REFLEX MICROSCOPIC
Bilirubin Urine: NEGATIVE
Glucose, UA: NEGATIVE mg/dL
Hgb urine dipstick: NEGATIVE
Ketones, ur: NEGATIVE mg/dL
Leukocytes,Ua: NEGATIVE
Nitrite: NEGATIVE
Protein, ur: NEGATIVE mg/dL
Specific Gravity, Urine: 1.02 (ref 1.005–1.030)
pH: 6 (ref 5.0–8.0)

## 2018-12-30 LAB — SALICYLATE LEVEL: Salicylate Lvl: <7 mg/dL (ref 2.8–30.0)

## 2018-12-30 LAB — ACETAMINOPHEN LEVEL: Acetaminophen (Tylenol), Serum: <10 ug/mL — ABNORMAL LOW (ref 10–30)

## 2018-12-30 LAB — VALPROIC ACID LEVEL: Valproic Acid Lvl: <10 ug/mL — ABNORMAL LOW (ref 50.0–100.0)

## 2018-12-30 LAB — CBG MONITORING, ED: Glucose-Capillary: 125 mg/dL — ABNORMAL HIGH (ref 70–99)

## 2018-12-30 LAB — LACTIC ACID, PLASMA: Lactic Acid, Venous: 1.2 mmol/L (ref 0.5–1.9)

## 2018-12-30 LAB — ETHANOL: Alcohol, Ethyl (B): <10 mg/dL (ref <10)

## 2018-12-30 MED ORDER — METRONIDAZOLE IN NACL 5-0.79 MG/ML-% IV SOLN
500.0000 mg | Freq: Once | INTRAVENOUS | Status: DC
  Administered 2018-12-30: 500 mg via INTRAVENOUS
  Filled 2018-12-30: qty 100

## 2018-12-30 MED ORDER — PROPOFOL 1000 MG/100ML IV EMUL
5.0000 ug/kg/min | INTRAVENOUS | Status: DC
  Administered 2018-12-30: 5 ug/kg/min via INTRAVENOUS
  Filled 2018-12-30: qty 100

## 2018-12-30 MED ORDER — SODIUM CHLORIDE 0.9 % IV SOLN
2.0000 g | Freq: Once | INTRAVENOUS | Status: DC
  Filled 2018-12-30: qty 2

## 2018-12-30 MED ORDER — SODIUM CHLORIDE 0.9 % IV BOLUS
1000.0000 mL | Freq: Once | INTRAVENOUS | Status: AC
  Administered 2018-12-30: 1000 mL via INTRAVENOUS

## 2018-12-30 MED ORDER — ROCURONIUM BROMIDE 50 MG/5ML IV SOLN
INTRAVENOUS | Status: AC | PRN
  Administered 2018-12-30: 70 mg via INTRAVENOUS

## 2018-12-30 MED ORDER — SODIUM CHLORIDE 0.9 % IV SOLN: 2.0000 g | Freq: Two times a day (BID) | INTRAVENOUS | Status: DC

## 2018-12-30 MED ORDER — LEVETIRACETAM IN NACL 1500 MG/100ML IV SOLN
1500.0000 mg | Freq: Once | INTRAVENOUS | Status: AC
  Administered 2018-12-30: 1500 mg via INTRAVENOUS
  Filled 2018-12-30 (×2): qty 100

## 2018-12-30 MED ORDER — VANCOMYCIN HCL IN DEXTROSE 1-5 GM/200ML-% IV SOLN
1000.0000 mg | Freq: Once | INTRAVENOUS | Status: DC
  Administered 2018-12-30: 1000 mg via INTRAVENOUS
  Filled 2018-12-30: qty 200

## 2018-12-30 MED ORDER — LORAZEPAM 2 MG/ML IJ SOLN
2.0000 mg | Freq: Once | INTRAMUSCULAR | Status: AC
  Administered 2018-12-30: 2 mg via INTRAVENOUS

## 2018-12-30 MED ORDER — DEXAMETHASONE SODIUM PHOSPHATE 10 MG/ML IJ SOLN: 10.0000 mg | Freq: Once | INTRAMUSCULAR | Status: DC

## 2018-12-30 MED ORDER — VANCOMYCIN HCL IN DEXTROSE 750-5 MG/150ML-% IV SOLN: 750.0000 mg | INTRAVENOUS | Status: DC

## 2018-12-30 MED ORDER — LEVETIRACETAM IN NACL 500 MG/100ML IV SOLN
INTRAVENOUS | Status: AC
  Filled 2018-12-30: qty 100

## 2018-12-30 MED ORDER — LORAZEPAM 2 MG/ML IJ SOLN
INTRAMUSCULAR | Status: AC
  Filled 2018-12-30: qty 1

## 2018-12-30 NOTE — ED Triage Notes (Signed)
Pt arrived to er by ems after being unresponsive at home, per ems pt;s spouse and son stated that pt was last seen awake at 30;00, family reported that pt has had decreased intake over the past several days, has hx of severe dementia,

## 2018-12-30 NOTE — ED Notes (Addendum)
ET tube pulled back to 21, FiO2 decreased to 40, s Blood gas turned in to lab results pending.

## 2018-12-30 NOTE — ED Notes (Signed)
Dr Laverta Baltimore notified of pt's trending down BP's. Propofol placed on hold for now and new orders given. Will continue to monitor.

## 2018-12-30 NOTE — ED Provider Notes (Signed)
Emergency Department Provider Note   I have reviewed the triage vital signs and the nursing notes.   HISTORY  Chief Complaint Altered Mental Status   HPI Jasmine [name] is a 63 y.o. female with past medical history of severe dementia and epilepsy presents to the emergency department by EMS unresponsive.  Patient was last seen normal at approximately 1 AM this morning.  According to EMS the patient was presumed to be sleeping all day but when family could not wake her up this evening they called 911.  EMS arrived to find the patient obtunded but breathing spontaneously with low oxygen saturation.  Blood sugar normal for them in route.  They placed nasal cannula and arrived to the emergency department.   Level 5 caveat: Unresponsive  I called the husband on the phone who advises that she is full code.  He states that he thought she may have been having a seizure this morning at around 6 AM.  He saw some "twitching" of her lower extremities but did not intervene.  He did not give her her Depakote this morning but states otherwise she has been compliant.  He states that she had not been complaining of anything yesterday and had seen like her normal self.  No recent falls.  He denies any alcohol or drug use.   Past Medical History:  Diagnosis Date  . Anxiety   . Arm fracture    right, steel pin  . Dementia ([facility])    dx 2018  . Depression     Patient Active Problem List   Diagnosis Date Noted  . ICH (intracerebral hemorrhage) ([facility]) 01/08/2019  . Dementia ([facility]) 05/03/2017  . Dermatitis 08/05/2015  . Chronic back pain 12/06/2013    Past Surgical History:  Procedure Laterality Date  . arm surgery Right    Fx, steel pin   . BACK SURGERY     "burning of nerve due to constant back pain 2002  . BREAST ENHANCEMENT SURGERY     removed 2017  . UTERINE FIBROID SURGERY  2017    Allergies Shellfish allergy, Sulfa antibiotics, and Lexapro [escitalopram oxalate]  No family history on  file.  Social History Social History   Tobacco Use  . Smoking status: Never Smoker  . Smokeless tobacco: Never Used  Substance Use Topics  . Alcohol use: Yes    Comment: social, 1/2 glass  . Drug use: No    Review of Systems  Level 5 caveat: Unresponsive.  ____________________________________________   PHYSICAL EXAM:  VITAL SIGNS: ED Triage Vitals  Enc Vitals Group     BP 01/18/2019 1900 104/87     Pulse Rate 01/03/2019 1900 (!) 109     Resp 01/16/2019 1900 (!) 23     Temp --      Temp src --      SpO2 01/03/2019 1900 (!) 84 %     Weight 12/20/2018 1901 92 lb 3.2 oz (41.8 kg)     Height 01/18/2019 1901 5\' 6"  (1.676 m)   Constitutional: Unresponsive to verbal and painful stimulus.  Patient with right gaze deviation.  Chronically ill-appearing and thin.  Eyes: Conjunctivae are normal.  Right pupil at 3 mm with left being pinpoint.  Head: Atraumatic. Nose: No congestion/rhinnorhea. Mouth/Throat: Mucous membranes are very dry.  Neck: No stridor.  Cardiovascular: Tachycardia. Good peripheral circulation. Grossly normal heart sounds.   Respiratory: Normal respiratory effort.  No retractions. Lungs CTAB. Gastrointestinal: No distention.  Musculoskeletal: No gross deformities of extremities. Neurologic:  Patient is obtunded.  She has right gaze deviation on arrival.  Shortly afterwards she developed low amplitude tonic/clonic activity in the right leg and right arm.  Skin:  Skin is warm, dry and intact. No rash noted.  ____________________________________________   LABS (all labs ordered are listed, but only abnormal results are displayed)  Labs Reviewed  COMPREHENSIVE METABOLIC PANEL - Abnormal; Notable for the following components:      Result Value   Glucose, Bld 136 (*)    Albumin 3.0 (*)    All other components within normal limits  CBC WITH DIFFERENTIAL/PLATELET - Abnormal; Notable for the following components:   WBC 15.0 (*)    Neutro Abs 11.8 (*)    Monocytes Absolute  1.5 (*)    Eosinophils Absolute 0.6 (*)    All other components within normal limits  ACETAMINOPHEN LEVEL - Abnormal; Notable for the following components:   Acetaminophen (Tylenol), Serum <10 (*)    All other components within normal limits  URINALYSIS, ROUTINE W REFLEX MICROSCOPIC - Abnormal; Notable for the following components:   APPearance HAZY (*)    All other components within normal limits  VALPROIC ACID LEVEL - Abnormal; Notable for the following components:   Valproic Acid Lvl <10 (*)    All other components within normal limits  BLOOD GAS, ARTERIAL - Abnormal; Notable for the following components:   pH, Arterial 7.561 (*)    pCO2 arterial 27.5 (*)    pO2, Arterial 362 (*)    Acid-Base Excess 2.3 (*)    All other components within normal limits  CBG MONITORING, ED - Abnormal; Notable for the following components:   Glucose-Capillary 125 (*)    All other components within normal limits  I-STAT CHEM 8, ED - Abnormal; Notable for the following components:   Glucose, Bld 129 (*)    All other components within normal limits  RESPIRATORY PANEL BY RT PCR (FLU A&B, COVID)  CULTURE, BLOOD (ROUTINE X 2)  CULTURE, BLOOD (ROUTINE X 2)  ETHANOL  SALICYLATE LEVEL  LACTIC ACID, PLASMA  LACTIC ACID, PLASMA  TRIGLYCERIDES   ____________________________________________  EKG  NSR. Normal intervals. Narrow QRS. No ST changes. No STEMI  ____________________________________________  RADIOLOGY  CT Head Wo Contrast  Result Date: 12/22/2018 CLINICAL DATA:  Recent history of seizure activity EXAM: CT HEAD WITHOUT CONTRAST TECHNIQUE: Contiguous axial images were obtained from the base of the skull through the vertex without intravenous contrast. COMPARISON:  None. FINDINGS: Brain: There is a large hemorrhagic mass which appears centered within the right thalamus which measures at least 7.0 x 5.8 cm in greatest transverse and AP dimensions respectively. It extends in the craniocaudad  projection for approximately 5.3 cm. It demonstrates significant mass effect upon the third ventricle and obstructive hydrocephalus is noted with dilatation of the lateral ventricles bilaterally. Intraventricular hemorrhage is noted on the left. There is generalized decreased attenuation in the periventricular tissues consistent with transependymal edema. No other definitive mass lesion is seen. The fourth ventricle demonstrates some minimal mass effect superiorly although not the etiology of the obstructive hydrocephalus. No other areas of hemorrhage are seen. Vascular: No hyperdense vessel or unexpected calcification. Skull: Normal. Negative for fracture or focal lesion. Sinuses/Orbits: No acute finding. Other: None. IMPRESSION: Hemorrhagic mass which appears to be centered within the right thalamus with significant mass effect upon the third ventricle with obstructive hydrocephalus and transependymal edema. 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 Glioblastoma  multiforme deserves consideration but felt to be less likely. MRI with contrast material is recommended. Urgent neurosurgical consultation is recommended. CT of the chest following clinical improvement if able is recommended to evaluate the perihilar abnormality on the left. Critical Value/emergent results were called by telephone at the time of interpretation on Jan 09, 2019 at 8:32 pm to Dr. Alona Bene , who verbally acknowledged these results. Electronically Signed   By: Alcide Clever M.D.   On: 01-09-2019 20:34   DG Chest Portable 1 View  Result Date: 01-09-2019 CLINICAL DATA:  Status post intubation EXAM: PORTABLE CHEST 1 VIEW COMPARISON:  10/29/2017 FINDINGS: The endotracheal tube appears to terminate at or near the left mainstem bronchus. Repositioning is recommended. The OG tube terminates below the left hemidiaphragm. The heart size is borderline enlarged. There is no large focal  infiltrate. No pneumothorax. There is a possible left perihilar airspace opacity. IMPRESSION: 1. The endotracheal tube terminates at or near the left mainstem bronchus. Repositioning is recommended. 2. OG tube terminates below the left hemidiaphragm. 3. No definite acute cardiopulmonary process identified on this exam. 4. There is a questionable left perihilar airspace opacity. Attention on repeat radiograph is recommended. These results were called by telephone at the time of interpretation on 01/09/2019 at 7:56 pm to provider Doniven Vanpatten , who verbally acknowledged these results. Electronically Signed   By: Katherine Mantle M.D.   On: 01-09-19 19:58    ____________________________________________   PROCEDURES  Procedure(s) performed:   .Critical Care Performed by: Maia Plan, MD Authorized by: Maia Plan, MD   Critical care provider statement:    Critical care time (minutes):  75   Critical care time was exclusive of:  Separately billable procedures and treating other patients and teaching time   Critical care was necessary to treat or prevent imminent or life-threatening deterioration of the following conditions:  CNS failure or compromise and respiratory failure   Critical care was time spent personally by me on the following activities:  Blood draw for specimens, development of treatment plan with patient or surrogate, discussions with consultants, examination of patient, evaluation of patient's response to treatment, obtaining history from patient or surrogate, ordering and performing treatments and interventions, ordering and review of laboratory studies, ordering and review of radiographic studies, pulse oximetry, re-evaluation of patient's condition, review of old charts and ventilator management   I assumed direction of critical care for this patient from another provider in my specialty: no   Date/Time: January 09, 2019 8:41 PM Performed by: Maia Plan, MD Pre-anesthesia  Checklist: Patient identified, Emergency Drugs available, Suction available and Patient being monitored Oxygen Delivery Method: Non-rebreather mask Preoxygenation: Pre-oxygenation with 100% oxygen Induction Type: Rapid sequence Laryngoscope Size: Glidescope and 4 Grade View: Grade II Tube size: 7.5 mm Number of attempts: 1 Airway Equipment and Method: Video-laryngoscopy Placement Confirmation: ETT inserted through vocal cords under direct vision,  CO2 detector and Breath sounds checked- equal and bilateral Secured at: 21 cm Tube secured with: ETT holder Dental Injury: Teeth and Oropharynx as per pre-operative assessment         ____________________________________________   INITIAL IMPRESSION / ASSESSMENT AND PLAN / ED COURSE  Pertinent labs & imaging results that were available during my care of the patient were reviewed by me and considered in my medical decision making (see chart for details).   Patient arrives to the emergency department unresponsive.  Blood sugar normal on arrival.  EMS with no significant history.  Patient is chronically very ill-appearing and  noted to have dementia.  While setting up for resuscitation and likely intubation I called the patient's husband for more history.  He advises that she has is a FULL CODE.  Patient intubated for airway protection.  She was given 2 mg of Ativan with question seizure with low amplitude tonic-clonic activity in the right arm and leg along with right gaze deviation.  Patient was intubated with rocuronium as above and started on propofol.  Will load with Keppra as Depakote is on national back order in IV formulation.  Sending patient for emergent head CT to rule out bleed.   08:30 PM  Spoke with radiology regarding the head CT.  There is a large cystic mass somewhere near the thalamus which appears to have bleeding and collapse of the third ventricle.  I have sent out a page to neurosurgery to discuss transfer for  ventriculostomy.  Will give Decadron.  Patient on vent and have moved the ET tube back.  Will repeat chest x-ray to assess proper positioning.  The radiologist states that also in looking at the chest x-ray there is some perihilar airspace opacity which could be a primary malignancy.  Will ultimately need chest CT but will defer after neurosurgical intervention.   Spoke with Dr. Johnsie Cancel.  I have placed temporary orders at his request for 4 N to his service. I spoke with the flow manager who will be able to place the patient seen.  Will call [facility] for emergency transport.   I spoke further with the husband who is now at bedside.  He states that the patient has had fairly rapid decline over the last 9 months.  He states that she has not had brain imaging since 2017 which was an MRI in [place] which showed early signs of dementia.  He states that she has been dragging her left foot and has lost the ability to speak and care for herself over the past several months.  In the past several weeks she has been completely bed-bound and is no longer speaking. She apparently did see a neurologist in February of this year but by husband's report no additional head imaging was done and was diagnosed with severe early dementia.  ____________________________________________  FINAL CLINICAL IMPRESSION(S) / ED DIAGNOSES  Final diagnoses:  None     MEDICATIONS GIVEN DURING THIS VISIT:  Medications  propofol (DIPRIVAN) 1000 MG/100ML infusion (5 mcg/kg/min  41.8 kg Intravenous Rate/Dose Verify 01/14/2019 2008)  sodium chloride 0.9 % bolus 1,000 mL (0 mLs Intravenous Stopped 01/01/2019 1949)  LORazepam (ATIVAN) injection 2 mg (2 mg Intravenous Given 01/15/2019 1910)  rocuronium (ZEMURON) injection (70 mg Intravenous Given 01/02/2019 1910)  levETIRAcetam (KEPPRA) IVPB 1500 mg/ 100 mL premix ( Intravenous Stopped 01/10/2019 2007)     Note:  This document was prepared using Dragon voice recognition software and may  include unintentional dictation errors.  Alona Bene, MD, [facility] Emergency Medicine    Laasia Arcos, Arlyss Repress, MD 01/18/19 1130

## 2018-12-30 NOTE — Progress Notes (Signed)
Pharmacy Antibiotic Note  Jasmine [name] is a 63 y.o. female admitted on 01/10/2019 with infection- unknown source.  Pharmacy has been consulted for Cefepime and Vancomycin dosing.  Plan: Vancomycin 1000 mg IV x 1 dose. Vancomycin 750 mg IV every 24 hours.  Goal trough 15-20 mcg/mL.  Cefepime 2000 mg IV every 12 hours. Monitor labs, c/s, and vanco level as indicated.  Height: 5\' 6"  (167.6 cm) Weight: 92 lb 3.2 oz (41.8 kg) IBW/kg (Calculated) : 59.3  Temp (24hrs), Avg:94.5 F (34.7 C), Min:94.1 F (34.5 C), Max:94.8 F (34.9 C)  Recent Labs  Lab 01/08/2019 1904 01/12/2019 1905  WBC 15.0*  --   CREATININE 0.45 0.50    Estimated Creatinine Clearance: 47.5 mL/min (by C-G formula based on SCr of 0.5 mg/dL).    Allergies  Allergen Reactions  . Shellfish Allergy   . Sulfa Antibiotics   . Lexapro [Escitalopram Oxalate] Rash    Antimicrobials this admission: Vanco 12/11 >>  Cefepime 12/11 >>   Dose adjustments this admission: Vanco/Cefepime  Microbiology results: 12/11 BCx: pending   Thank you for allowing pharmacy to be a part of this patient's care.  Ramond Craver 01/09/2019 8:17 PM

## 2018-12-31 ENCOUNTER — Inpatient Hospital Stay (HOSPITAL_COMMUNITY): Payer: BLUE CROSS/BLUE SHIELD

## 2018-12-31 ENCOUNTER — Encounter (HOSPITAL_COMMUNITY): Admission: EM | Disposition: E | Payer: Self-pay | Source: Home / Self Care | Attending: Neurological Surgery

## 2018-12-31 ENCOUNTER — Inpatient Hospital Stay (HOSPITAL_COMMUNITY): Payer: BLUE CROSS/BLUE SHIELD | Admitting: Certified Registered Nurse Anesthetist

## 2018-12-31 ENCOUNTER — Encounter (HOSPITAL_COMMUNITY): Payer: Self-pay | Admitting: Neurological Surgery

## 2018-12-31 ENCOUNTER — Other Ambulatory Visit: Payer: Self-pay

## 2018-12-31 DIAGNOSIS — L8990 Pressure ulcer of unspecified site, unspecified stage: Secondary | ICD-10-CM | POA: Insufficient documentation

## 2018-12-31 HISTORY — PX: CRANIOTOMY: SHX93

## 2018-12-31 LAB — PROTIME-INR
INR: 1.2 (ref 0.8–1.2)
Prothrombin Time: 15 seconds (ref 11.4–15.2)

## 2018-12-31 LAB — PREPARE RBC (CROSSMATCH)

## 2018-12-31 LAB — ABO/RH: ABO/RH(D): O POS

## 2018-12-31 LAB — APTT: aPTT: 30 seconds (ref 24–36)

## 2018-12-31 LAB — MRSA PCR SCREENING: MRSA by PCR: NEGATIVE

## 2018-12-31 SURGERY — CRANIOTOMY HEMATOMA EVACUATION SUBDURAL
Anesthesia: General | Site: Head | Laterality: Right

## 2018-12-31 MED ORDER — SODIUM CHLORIDE 0.9 % IV SOLN
INTRAVENOUS | Status: DC | PRN
  Administered 2018-12-31: 09:00:00 via INTRAVENOUS

## 2018-12-31 MED ORDER — PROMETHAZINE HCL 25 MG PO TABS: 12.5000 mg | ORAL_TABLET | ORAL | Status: DC | PRN

## 2018-12-31 MED ORDER — ONDANSETRON HCL 4 MG PO TABS: 4.0000 mg | ORAL_TABLET | ORAL | Status: DC | PRN

## 2018-12-31 MED ORDER — PROPOFOL 1000 MG/100ML IV EMUL: 5.0000 ug/kg/min | INTRAVENOUS | Status: DC

## 2018-12-31 MED ORDER — PHENYLEPHRINE HCL-NACL 10-0.9 MG/250ML-% IV SOLN
0.0000 ug/min | INTRAVENOUS | Status: DC
  Administered 2018-12-31: 40 ug/min via INTRAVENOUS
  Administered 2018-12-31: 80 ug/min via INTRAVENOUS
  Administered 2018-12-31: 100 ug/min via INTRAVENOUS
  Administered 2018-12-31: 90 ug/min via INTRAVENOUS
  Administered 2018-12-31: 60 ug/min via INTRAVENOUS
  Administered 2018-12-31: 20 ug/min via INTRAVENOUS
  Administered 2018-12-31 – 2019-01-01 (×2): 100 ug/min via INTRAVENOUS
  Administered 2019-01-01: 80 ug/min via INTRAVENOUS
  Administered 2019-01-01: 70 ug/min via INTRAVENOUS
  Administered 2019-01-01: 60 ug/min via INTRAVENOUS
  Administered 2019-01-01: 80 ug/min via INTRAVENOUS
  Administered 2019-01-01: 60 ug/min via INTRAVENOUS
  Administered 2019-01-01: 100 ug/min via INTRAVENOUS
  Administered 2019-01-01: 80 ug/min via INTRAVENOUS
  Administered 2019-01-01 (×2): 100 ug/min via INTRAVENOUS
  Administered 2019-01-01: 120 ug/min via INTRAVENOUS
  Administered 2019-01-02 (×3): 60 ug/min via INTRAVENOUS
  Administered 2019-01-02: 45 ug/min via INTRAVENOUS
  Administered 2019-01-02: 55 ug/min via INTRAVENOUS
  Administered 2019-01-03: 15 ug/min via INTRAVENOUS
  Administered 2019-01-03: 20 ug/min via INTRAVENOUS
  Filled 2018-12-31 (×5): qty 250
  Filled 2018-12-31: qty 500
  Filled 2018-12-31 (×2): qty 250
  Filled 2018-12-31: qty 500
  Filled 2018-12-31: qty 250
  Filled 2018-12-31: qty 1000
  Filled 2018-12-31 (×6): qty 250
  Filled 2018-12-31: qty 500
  Filled 2018-12-31: qty 250
  Filled 2018-12-31: qty 500
  Filled 2018-12-31: qty 250

## 2018-12-31 MED ORDER — ONDANSETRON HCL 4 MG/2ML IJ SOLN: 4.0000 mg | INTRAMUSCULAR | Status: DC | PRN

## 2018-12-31 MED ORDER — 0.9 % SODIUM CHLORIDE (POUR BTL) OPTIME
TOPICAL | Status: DC | PRN
  Administered 2018-12-31 (×3): 1000 mL

## 2018-12-31 MED ORDER — DEXAMETHASONE SODIUM PHOSPHATE 10 MG/ML IJ SOLN
INTRAMUSCULAR | Status: DC | PRN
  Administered 2018-12-31: 10 mg via INTRAVENOUS

## 2018-12-31 MED ORDER — CHLORHEXIDINE GLUCONATE 0.12 % MT SOLN
OROMUCOSAL | Status: AC
  Administered 2019-01-01: 15 mL via OROMUCOSAL
  Filled 2018-12-31: qty 15

## 2018-12-31 MED ORDER — ORAL CARE MOUTH RINSE
15.0000 mL | OROMUCOSAL | Status: DC
  Administered 2018-12-31 – 2019-01-12 (×125): 15 mL via OROMUCOSAL

## 2018-12-31 MED ORDER — LIDOCAINE-EPINEPHRINE 1 %-1:100000 IJ SOLN
INTRAMUSCULAR | Status: DC | PRN
  Administered 2018-12-31: 10 mL

## 2018-12-31 MED ORDER — PROPOFOL 10 MG/ML IV BOLUS
INTRAVENOUS | Status: AC
  Filled 2018-12-31: qty 20

## 2018-12-31 MED ORDER — THROMBIN 20000 UNITS EX SOLR
CUTANEOUS | Status: AC
  Filled 2018-12-31: qty 20000

## 2018-12-31 MED ORDER — PHENYLEPHRINE HCL (PRESSORS) 10 MG/ML IV SOLN
INTRAVENOUS | Status: DC | PRN
  Administered 2018-12-31 (×2): 80 ug via INTRAVENOUS

## 2018-12-31 MED ORDER — PROPOFOL 10 MG/ML IV BOLUS
INTRAVENOUS | Status: DC | PRN
  Administered 2018-12-31: 40 mg via INTRAVENOUS

## 2018-12-31 MED ORDER — ROCURONIUM BROMIDE 100 MG/10ML IV SOLN
INTRAVENOUS | Status: DC | PRN
  Administered 2018-12-31: 100 mg via INTRAVENOUS

## 2018-12-31 MED ORDER — CEFAZOLIN SODIUM-DEXTROSE 2-3 GM-%(50ML) IV SOLR
INTRAVENOUS | Status: DC | PRN
  Administered 2018-12-31: 2 g via INTRAVENOUS

## 2018-12-31 MED ORDER — THROMBIN 5000 UNITS EX SOLR
OROMUCOSAL | Status: DC | PRN
  Administered 2018-12-31: 10:00:00 via TOPICAL

## 2018-12-31 MED ORDER — SODIUM CHLORIDE 0.9 % IV BOLUS
500.0000 mL | Freq: Once | INTRAVENOUS | Status: AC
  Administered 2018-12-31: 500 mL via INTRAVENOUS

## 2018-12-31 MED ORDER — LIDOCAINE-EPINEPHRINE 1 %-1:100000 IJ SOLN
INTRAMUSCULAR | Status: AC
  Filled 2018-12-31: qty 1

## 2018-12-31 MED ORDER — POLYETHYLENE GLYCOL 3350 17 G PO PACK: 17.0000 g | PACK | Freq: Every day | ORAL | Status: DC | PRN

## 2018-12-31 MED ORDER — LABETALOL HCL 5 MG/ML IV SOLN
10.0000 mg | INTRAVENOUS | Status: DC | PRN
  Administered 2019-01-04: 10 mg via INTRAVENOUS
  Filled 2018-12-31: qty 4

## 2018-12-31 MED ORDER — FENTANYL CITRATE (PF) 250 MCG/5ML IJ SOLN
INTRAMUSCULAR | Status: AC
  Filled 2018-12-31: qty 5

## 2018-12-31 MED ORDER — GADOBUTROL 1 MMOL/ML IV SOLN
4.0000 mL | Freq: Once | INTRAVENOUS | Status: AC | PRN
  Administered 2018-12-31: 4 mL via INTRAVENOUS

## 2018-12-31 MED ORDER — IOHEXOL 300 MG/ML  SOLN
100.0000 mL | Freq: Once | INTRAMUSCULAR | Status: AC | PRN
  Administered 2018-12-31: 100 mL via INTRAVENOUS

## 2018-12-31 MED ORDER — ONDANSETRON HCL 4 MG/2ML IJ SOLN
INTRAMUSCULAR | Status: DC | PRN
  Administered 2018-12-31: 4 mg via INTRAVENOUS

## 2018-12-31 MED ORDER — SODIUM CHLORIDE 0.9 % IV SOLN
INTRAVENOUS | Status: DC | PRN
  Administered 2018-12-31: 10:00:00 via INTRAVENOUS

## 2018-12-31 MED ORDER — SODIUM CHLORIDE 0.9 % IV SOLN
INTRAVENOUS | Status: DC | PRN
  Administered 2018-12-31: 10:00:00

## 2018-12-31 MED ORDER — SODIUM CHLORIDE 0.9 % IV SOLN
INTRAVENOUS | Status: DC
  Administered 2018-12-31 – 2019-01-02 (×4): via INTRAVENOUS

## 2018-12-31 MED ORDER — HEMOSTATIC AGENTS (NO CHARGE) OPTIME
TOPICAL | Status: DC | PRN
  Administered 2018-12-31: 1 via TOPICAL

## 2018-12-31 MED ORDER — FENTANYL CITRATE (PF) 100 MCG/2ML IJ SOLN
INTRAMUSCULAR | Status: DC | PRN
  Administered 2018-12-31: 50 ug via INTRAVENOUS
  Administered 2018-12-31: 100 ug via INTRAVENOUS

## 2018-12-31 MED ORDER — ALBUMIN HUMAN 5 % IV SOLN
INTRAVENOUS | Status: DC | PRN
  Administered 2018-12-31: 10:00:00 via INTRAVENOUS

## 2018-12-31 MED ORDER — BACITRACIN ZINC 500 UNIT/GM EX OINT
TOPICAL_OINTMENT | CUTANEOUS | Status: DC | PRN
  Administered 2018-12-31: 1 via TOPICAL

## 2018-12-31 MED ORDER — DOCUSATE SODIUM 100 MG PO CAPS: 100.0000 mg | ORAL_CAPSULE | Freq: Two times a day (BID) | ORAL | Status: DC

## 2018-12-31 MED ORDER — THROMBIN 5000 UNITS EX SOLR
CUTANEOUS | Status: AC
  Filled 2018-12-31: qty 5000

## 2018-12-31 MED ORDER — CHLORHEXIDINE GLUCONATE 0.12% ORAL RINSE (MEDLINE KIT)
15.0000 mL | Freq: Two times a day (BID) | OROMUCOSAL | Status: DC
  Administered 2018-12-31 – 2019-01-12 (×24): 15 mL via OROMUCOSAL

## 2018-12-31 MED ORDER — BACITRACIN ZINC 500 UNIT/GM EX OINT
TOPICAL_OINTMENT | CUTANEOUS | Status: AC
  Filled 2018-12-31: qty 28.35

## 2018-12-31 MED ORDER — ACETAMINOPHEN 650 MG RE SUPP: 650.0000 mg | RECTAL | Status: DC | PRN

## 2018-12-31 MED ORDER — THROMBIN 20000 UNITS EX SOLR
CUTANEOUS | Status: DC | PRN
  Administered 2018-12-31: 10:00:00 via TOPICAL

## 2018-12-31 MED ORDER — PHENYLEPHRINE HCL-NACL 10-0.9 MG/250ML-% IV SOLN
INTRAVENOUS | Status: DC | PRN
  Administered 2018-12-31: 40 ug/min via INTRAVENOUS

## 2018-12-31 MED ORDER — CHLORHEXIDINE GLUCONATE CLOTH 2 % EX PADS
6.0000 | MEDICATED_PAD | Freq: Every day | CUTANEOUS | Status: DC
  Administered 2018-12-31 – 2019-01-20 (×15): 6 via TOPICAL

## 2018-12-31 MED ORDER — ACETAMINOPHEN 325 MG PO TABS: 650.0000 mg | ORAL_TABLET | ORAL | Status: DC | PRN

## 2018-12-31 SURGICAL SUPPLY — 48 items
BLADE CLIPPER SURG (BLADE) ×2 IMPLANT
BNDG GAUZE ELAST 4 BULKY (GAUZE/BANDAGES/DRESSINGS) IMPLANT
BUR ACORN 9.0 PRECISION (BURR) ×2 IMPLANT
BUR SPIRAL ROUTER 2.3 (BUR) ×2 IMPLANT
CANISTER SUCT 3000ML PPV (MISCELLANEOUS) ×2 IMPLANT
DRAPE NEUROLOGICAL W/INCISE (DRAPES) ×2 IMPLANT
DRAPE SURG 17X23 STRL (DRAPES) IMPLANT
DRAPE WARM FLUID 44X44 (DRAPES) ×2 IMPLANT
DURAPREP 6ML APPLICATOR 50/CS (WOUND CARE) ×2 IMPLANT
ELECT REM PT RETURN 9FT ADLT (ELECTROSURGICAL) ×2
ELECTRODE REM PT RTRN 9FT ADLT (ELECTROSURGICAL) ×1 IMPLANT
GAUZE SPONGE 4X4 12PLY STRL (GAUZE/BANDAGES/DRESSINGS) ×2 IMPLANT
GLOVE BIO SURGEON STRL SZ7.5 (GLOVE) ×4 IMPLANT
GLOVE BIOGEL PI IND STRL 7.5 (GLOVE) ×2 IMPLANT
GLOVE BIOGEL PI INDICATOR 7.5 (GLOVE) ×2
GOWN STRL REUS W/ TWL LRG LVL3 (GOWN DISPOSABLE) ×2 IMPLANT
GOWN STRL REUS W/ TWL XL LVL3 (GOWN DISPOSABLE) IMPLANT
GOWN STRL REUS W/TWL 2XL LVL3 (GOWN DISPOSABLE) IMPLANT
GOWN STRL REUS W/TWL LRG LVL3 (GOWN DISPOSABLE) ×2
GOWN STRL REUS W/TWL XL LVL3 (GOWN DISPOSABLE)
HEMOSTAT POWDER KIT SURGIFOAM (HEMOSTASIS) ×2 IMPLANT
HEMOSTAT SURGICEL 2X14 (HEMOSTASIS) ×2 IMPLANT
KIT BASIN OR (CUSTOM PROCEDURE TRAY) ×2 IMPLANT
KIT TURNOVER KIT B (KITS) ×2 IMPLANT
NEEDLE HYPO 22GX1.5 SAFETY (NEEDLE) ×2 IMPLANT
NS IRRIG 1000ML POUR BTL (IV SOLUTION) ×2 IMPLANT
PACK CRANIOTOMY CUSTOM (CUSTOM PROCEDURE TRAY) ×2 IMPLANT
PLATE 1.5  2HOLE MED NEURO (Plate) ×2 IMPLANT
PLATE 1.5 2HOLE MED NEURO (Plate) ×2 IMPLANT
PLATE 1.5/0.5 18.5MM BURR HOLE (Plate) ×2 IMPLANT
SCREW SELF DRILL HT 1.5/4MM (Screw) ×16 IMPLANT
SPONGE SURGIFOAM ABS GEL 100 (HEMOSTASIS) ×2 IMPLANT
STAPLER VISISTAT 35W (STAPLE) ×2 IMPLANT
STOCKINETTE 6  STRL (DRAPES) ×1
STOCKINETTE 6 STRL (DRAPES) ×1 IMPLANT
SUT ETHILON 3 0 FSL (SUTURE) IMPLANT
SUT ETHILON 3 0 PS 1 (SUTURE) IMPLANT
SUT NURALON 4 0 TR CR/8 (SUTURE) ×6 IMPLANT
SUT STEEL 0 (SUTURE)
SUT STEEL 0 18XMFL TIE 17 (SUTURE) IMPLANT
SUT VIC AB 0 CT1 18XCR BRD8 (SUTURE) ×2 IMPLANT
SUT VIC AB 0 CT1 8-18 (SUTURE) ×2
SUT VIC AB 3-0 SH 8-18 (SUTURE) ×4 IMPLANT
TOWEL GREEN STERILE (TOWEL DISPOSABLE) ×2 IMPLANT
TOWEL GREEN STERILE FF (TOWEL DISPOSABLE) ×2 IMPLANT
TUBE CONNECTING 12X1/4 (SUCTIONS) ×2 IMPLANT
UNDERPAD 30X30 (UNDERPADS AND DIAPERS) ×2 IMPLANT
WATER STERILE IRR 1000ML POUR (IV SOLUTION) ×2 IMPLANT

## 2018-12-31 NOTE — H&P (Signed)
Neurosurgery H&P  CC: Altered mental status  HPI: This is a 63 y.o. woman that presents with acute obtundation. She has a history of dementia that developed over the past 2-3 years, no family history of dementia. Given the patient's mental status, I got the rest of the history from the patient's husband. He reports she was doing well prior to this event and was at her baseline, he found her unresponsive at home. No known ingestions, no new medications, no known trauma. No recent use of anti-platelet or anti-coagulant medications.    ROS: A 14 point ROS was performed and is negative except as noted in the HPI.   PMHx:  Past Medical History:  Diagnosis Date  . Anxiety   . Arm fracture    right, steel pin  . Dementia ([place])    dx 2018  . Depression    FamHx: No family history on file. SocHx:  reports that she has never smoked. She has never used smokeless tobacco. She reports current alcohol use. She reports that she does not use drugs.  Exam: Vital signs in last 24 hours: Temp:  [87.4 F (30.8 C)-94.8 F (34.9 C)] 92.3 F (33.5 C) (12/12 0215) Pulse Rate:  [72-114] 74 (12/12 0215) Resp:  [10-23] 10 (12/12 0215) BP: (76-124)/(62-100) 98/78 (12/12 0215) SpO2:  [84 %-100 %] 100 % (12/12 0215) FiO2 (%):  [40 %-100 %] 40 % (12/11 2350) Weight:  [41.8 kg] 41.8 kg (12/11 1901) General: Lying in ICU bed, appears acutely ill  Head: normocephalic and atruamatic HEENT: neck supple Pulmonary: intubated, good chest rise bilaterally Cardiac: RRR Abdomen: S NT ND Extremities: warm and well perfused x4 Neuro: intubated, no sedation, pupils pinpoint bilaterally, +corneals bilaterally but much more brisk on the right than left, gaze midline and conjugate, withdrawing upper and lower extremities to painful stimulus, not FC  Assessment and Plan: 63 y.o. woman with acute obtundation. [place] personally reviewed, which shows very large right sided mixed density mass, likely hypodense tumor with  hyperdense hemorrhage products, ventricles enlarged with transependymal flow consistent with hydrocephalus.   -discussed the above with the patient's husband at length. I informed him that I am very pessimistic about her outcome. Given the hilar mass on CXR, she likely has metastatic lung cancer with a hemorrhagic brain met or hemorrhagic primary brain tumor with hydrocephalus. He thinks the patient would want everything done. Her history of dementia is very strange, given her young age, lack of family history, rapid progression, etc. We discussed r/b/a of treatment versus palliative care and he would like to proceed with aggressive treatment. I explained that this would consist of placing an emergent EVD at bedside now then OR in the morning for tumor debulking / hematoma removal.   Neuro: -EVD now, will keep at +10 above tragus -OR in AM for tumor debulking and to get a diagnosis  Cardiopulm: -phenylepherine prn to keep MAP>65  FENGI: -NPO for OR  Heme/Onc/ID: -will get path from OR and determine further workup post-op  Endo/PPx/other: -warming blanket for hypothermia -hold DVT chemoPPx until post-op day [place], MD 01/10/2019 2:27 AM [facility]

## 2018-12-31 NOTE — Anesthesia Procedure Notes (Signed)
Arterial Line Insertion Start/End12/18/2020 9:30 AM, 12/28/2018 9:40 AM Performed by: Blayne Garlick T, Immunologist, CRNA  Patient location: OR. Preanesthetic checklist: patient identified, IV checked, risks and benefits discussed, surgical consent, pre-op evaluation and timeout performed Patient sedated Left, radial was placed Catheter size: 20 G Hand hygiene performed  and maximum sterile barriers used   Attempts: 1 Procedure performed without using ultrasound guided technique. Following insertion, dressing applied and Biopatch. Post procedure assessment: normal  Patient tolerated the procedure well with no immediate complications.

## 2018-12-31 NOTE — Progress Notes (Addendum)
Updated Dr. Zada Finders that EVD drain had no output or pulsatile motion prior to transport to MRI and CT. MD aware and wanted to decrease output prior to this update. EVD orders modified to raise EVD to 15 cm H20. Will monitor closely.

## 2018-12-31 NOTE — Brief Op Note (Signed)
01/04/2019  11:15 AM  PATIENT:  Jasmine [name]  63 y.o. 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   Delay start of Pharmacological VTE agent (>24hrs) due to surgical blood loss or risk of bleeding: yes

## 2018-12-31 NOTE — Progress Notes (Signed)
Notified Dr. Zada Finders of heart monitoring showing small ST elevation. 12 lead EKG ordered to assess.

## 2018-12-31 NOTE — Procedures (Signed)
PREOP DX: Hydrocephalus  POSTOP DX: Same  PROCEDURE: Left frontal ventriculostomy   SURGEON: Dr. Emelda Brothers, MD  ANESTHESIA: IV Sedation (versed and fentanyl) with Local  EBL: Minimal  SPECIMENS: None  COMPLICATIONS: None  CONDITION: Hemodynamically stable  INDICATIONS: Jasmine [name] is a 63 y.o. female woman with acute obtundation, CTH showed obstructive hydro 2/2 hemorrhagic right hemispheric mass.  PROCEDURE IN DETAIL: After consent was obtained from the patient's family, skin of the left frontal scalp was clipped, prepped and draped in the usual sterile fashion.  Scalp was then infiltrated with local anesthetic with epinephrine.  Skin incision was made sharply, and twist drill burr hole was made.  The dura was then incised, and the ventricular catheter was passed in first attempt into the left lateral ventricle.  Good CSF flow was obtained.  The catheter was then tunneled subcutaneously and connected to a drainage system and the skin incision closed.  The drain was then secured in place.  FINDINGS: 1. Opening pressure moderately elevated 2. Clear CSF

## 2018-12-31 NOTE — Anesthesia Preprocedure Evaluation (Signed)
Anesthesia Evaluation  Patient identified by MRN, date of birth, ID band Patient unresponsive    Reviewed: Allergy & Precautions, NPO status , Patient's Chart, lab work & pertinent test results, Unable to perform ROS - Chart review only  Airway Mallampati: Intubated       Dental   Pulmonary    Pulmonary exam normal        Cardiovascular Normal cardiovascular exam     Neuro/Psych Anxiety Depression Dementia    GI/Hepatic   Endo/Other    Renal/GU      Musculoskeletal   Abdominal   Peds  Hematology   Anesthesia Other Findings   Reproductive/Obstetrics                             Anesthesia Physical Anesthesia Plan  ASA: IV  Anesthesia Plan: General   Post-op Pain Management:    Induction: Intravenous  PONV Risk Score and Plan: 3 and Treatment may vary due to age or medical condition  Airway Management Planned: Oral ETT  Additional Equipment: Arterial line  Intra-op Plan:   Post-operative Plan: Post-operative intubation/ventilation  Informed Consent: I have reviewed the patients History and Physical, chart, labs and discussed the procedure including the risks, benefits and alternatives for the proposed anesthesia with the patient or authorized representative who has indicated his/her understanding and acceptance.       Plan Discussed with: CRNA and Surgeon  Anesthesia Plan Comments:         Anesthesia Quick Evaluation

## 2018-12-31 NOTE — Progress Notes (Signed)
Neurosurgery Service Post-operative progress note  S/p craniotomy for hemorrhagic tumor debulking, post-op MRI obtained, which shows expected residual tumor in the brainstem, which was not resected due to risk of further injury. Diffusion changes deep to residual tumor / pons likely ischemic injury from mass effect during tumor apoplexy, small areas of scattered diffusion without specific pattern. R subdural pneumo / hematoma likely 2/2 small cortical volume after decompression in the setting of EVD drainage, no physiologic reason for tension pneumo to develop.  -updated pt's husband, will see if her exam improves after reducing pressure, but I did warn him that I'm pessimistic -raise EVD to +15 to prevent worsening of subdural -CT A/P done, read pending, from my brief review there is confirmation of a left pleural-based mass, will f/u final read  Judith Part  01/05/2019 4:01 PM

## 2018-12-31 NOTE — Progress Notes (Signed)
 Initial Nutrition Assessment  DOCUMENTATION CODES:   Underweight  INTERVENTION:   Recommend initiation of TF within 24-48 hours  Tube Feeding: Osmolite 1.2 at 55 ml/hr; Begin at 20 ml/hr, titrate by 10 mL q 12 hours until goal rate of 55 ml/hr Provides 73 g of protein, 1584 kcals and 1069 mL of free water Meets 100% estimated calorie and protein needs  Monitor magnesium, potassium, and phosphorus for at least 5 occurences, MD to replete as needed, as pt is at risk for refeeding syndrome given underweight BMI 14, presume severe malnutrition.   NUTRITION DIAGNOSIS:   Inadequate oral intake related to acute illness as evidenced by NPO status.  GOAL:   Patient will meet greater than or equal to 90% of their needs  MONITOR:   Vent status, TF tolerance, Skin, Weight trends, Labs  REASON FOR ASSESSMENT:   Ventilator    ASSESSMENT:   63 yo female presents with progressive cognitive decline then acute obtundation and found to have large right sided intracranial tumor with hemorrhage/apoplexy causing acute hydrocephalus.Marland Kitchen PMH includes dementia (dx 2018), depression   RD working remotely.  12/11 Chest xray with mass like opacity in left middle lobe, concerning for malignancy 12/12 R. Craniotomy for evacuation of intracerebral hemhorrage and tumor debulking/resection  Noted CT abdomen/pevlis and CT chest pending as well as MRI brain  Patient is currently intubated on ventilator support MV: 4.5 L/min Temp (24hrs), Avg:95 F (35 C), Min:87.4 F (30.8 C), Max:99.5 F (37.5 C)  Unable to obtain diet and weight history from patient at this time  Current wt 39.6 kg; pt with documented weight of 56 kg in February of 2020; suggests 29% in <1 year  Based on current weight/BMI and suspected weight loss from weight encounters, pt likely meets criteria for severe malnutrition. Unable to recommend diagnosis at this time given limited information. Unable to perform NFPE, will reassess  on follow.   OG tube with tip in stomach  Labs: reviewed Meds: NS at 75 ml/hr   NUTRITION - FOCUSED PHYSICAL EXAM:  Unable to assess, working remotely  Diet Order:   Diet Order            Diet NPO time specified  Diet effective now              EDUCATION NEEDS:   Not appropriate for education at this time  Skin:  Skin Assessment: Skin Integrity Issues: Skin Integrity Issues:: Stage III, DTI DTI: hip Stage III: buttocks  Last BM:  no documented BM  Height:   Ht Readings from Last 1 Encounters:  12/25/2018 5\' 6"  (1.676 m)    Weight:   Wt Readings from Last 1 Encounters:  01/13/2019 39.6 kg    Ideal Body Weight:  59 kg  BMI:  Body mass index is 14.09 kg/m.  Estimated Nutritional Needs:   Kcal:  1400-1600 kcals  Protein:  70-80  Fluid:  >/= 1.4 L     Ashleah Valtierra MS, RDN, LDN, CNSC [phone] Pager  [phone] Weekend/On-Call Pager

## 2018-12-31 NOTE — Progress Notes (Signed)
eLink Physician-Brief Progress Note Patient Name: Jasmine [name] DOB: [birth year] MRN: [id number]   Date of Service  01/06/2019  HPI/Events of Note  Notified of hypotension BP 75/61  HR 80. Received 2 liters in the ED. Bedside RN reports no response to simulation now off propofol  eICU Interventions  Ordered another 500 cc NS bolus. Neurosurgeons made aware and on their way to assess. Goals of care wll need to be discussed given advance dementia at baseline.     Intervention Category Major Interventions: Hypotension - evaluation and management  Judd Lien 01/17/2019, 12:59 AM

## 2018-12-31 NOTE — Progress Notes (Signed)
PT Cancellation Note  Patient Details Name: Jasmine [name] MRN: [id number] DOB: 04/20/1955   Cancelled Treatment:    Reason Eval/Treat Not Completed: Medical issues which prohibited therapy;Patient at procedure or test/unavailable per RN, pt not responsive. [name] to OR this AM. Will follow up for readiness.    Marguarite Arbour A Delta Deshmukh 12/24/2018, 10:34 AM Marisa Severin, PT, DPT Acute Rehabilitation Services Pager [phone] Office [phone]

## 2018-12-31 NOTE — Anesthesia Postprocedure Evaluation (Signed)
Anesthesia Post Note  Patient: Jasmine [name]  Procedure(s) Performed: CRANIOTOMY FOR RESECTION OF TUMOR (Right Head)     Patient location during evaluation: SICU Anesthesia Type: General Level of consciousness: patient remains intubated per anesthesia plan Pain management: pain level controlled Vital Signs Assessment: post-procedure vital signs reviewed and stable Respiratory status: patient on ventilator - see flowsheet for VS Cardiovascular status: blood pressure returned to baseline and stable Postop Assessment: no apparent nausea or vomiting Anesthetic complications: no    Last Vitals:  Vitals:   12/29/2018 1245 01/16/2019 1300  BP:  91/73  Pulse: 82 81  Resp: 10 10  Temp: (!) 35.3 C (!) 35.4 C  SpO2: 100% 100%    Last Pain: There were no vitals filed for this visit.               [place]

## 2018-12-31 NOTE — Op Note (Signed)
PATIENT: Jasmine [name]  DAY OF SURGERY: 12/27/2018   PRE-OPERATIVE DIAGNOSIS:  Brain tumor, intracerebral hemorrhage   POST-OPERATIVE DIAGNOSIS:  Same   PROCEDURE:  Right craniotomy for evacuation of intracerebral hemorrhage and tumor debulking/resection   SURGEON:  Surgeon(s) and Role:    Judith Part, MD - Primary    Ovidio Kin, MD - Assisting   ANESTHESIA: ETGA   BRIEF HISTORY: This is a 63 year old woman who presented with progressive cognitive decline then acute obtundation. The patient was found to have a large right sided intracranial tumor with hemorrhage/apoplexy causing acute hydrocephalus. I spoke with the family extensively regarding goals of care and my overall pessimism regarding a good functional outcome, but they wanted to proceed so I emergently placed an EVD to treat the hydrocephalus then to the OR for evacuation of the hemorrhage and debulking of the tumor.   OPERATIVE DETAIL: The patient was taken to the operating room and placed on the OR table in the supine position. A formal time out was performed with two patient identifiers and confirmed the operative site. Anesthesia was induced by the anesthesia team. The operative site was marked, hair was clipped with surgical clippers, the area was then prepped and draped in a sterile fashion. Dr. Rita Ohara scrubbed in to assist me for the duration of the procedure until closure began. A linear incision was placed above the EAC on the right. Soft tissue was dissected and a craniotomy flap was turned. The dura was opened and a corticotomy was created to access the tumor. The tumor was dissected circumferentially and was grossly abnormal, with a mucoid appearance. Samples were sent for pathology and the visible tumor components were removed with suction. Hemostasis was confirmed and the dura was closed with suture. The bone flap was plated and replaced.   All instrument and sponge counts were correct, the incision was then  closed in layers. The patient was then returned to anesthesia for emergence. No apparent complications at the completion of the procedure.   EBL:  123mL   DRAINS: none   SPECIMENS: Right parietal brain tumor   Judith Part, MD 01/06/2019 11:16 AM

## 2018-12-31 NOTE — Transfer of Care (Signed)
Immediate Anesthesia Transfer of Care Note  Patient: Jasmine [name]  Procedure(s) Performed: CRANIOTOMY FOR RESECTION OF TUMOR (Right Head)  Patient Location: ICU  Anesthesia Type:General  Level of Consciousness: Patient remains intubated per anesthesia plan  Airway & Oxygen Therapy: Patient remains intubated per anesthesia plan and Patient placed on Ventilator (see vital sign flow sheet for setting)  Post-op Assessment: Report given to RN and Post -op Vital signs reviewed and stable  Post vital signs: Reviewed and stable  Last Vitals:  Vitals Value Taken Time  BP 85/72 12/22/2018 1133  Temp 35.6 C 12/30/2018 1137  Pulse 95 01/07/2019 1137  Resp 10 01/11/2019 1137  SpO2 100 % 01/16/2019 1137  Vitals shown include unvalidated device data.  Last Pain: There were no vitals filed for this visit.       Complications: No apparent anesthesia complications

## 2019-01-01 ENCOUNTER — Inpatient Hospital Stay (HOSPITAL_COMMUNITY): Payer: BLUE CROSS/BLUE SHIELD

## 2019-01-01 DIAGNOSIS — J95821 Acute postprocedural respiratory failure: Secondary | ICD-10-CM

## 2019-01-01 DIAGNOSIS — I615 Nontraumatic intracerebral hemorrhage, intraventricular: Secondary | ICD-10-CM

## 2019-01-01 LAB — MAGNESIUM: Magnesium: 1.7 mg/dL (ref 1.7–2.4)

## 2019-01-01 LAB — POCT I-STAT 7, (LYTES, BLD GAS, ICA,H+H)
Acid-base deficit: 4 mmol/L — ABNORMAL HIGH (ref 0.0–2.0)
Bicarbonate: 19.3 mmol/L — ABNORMAL LOW (ref 20.0–28.0)
Calcium, Ion: 1.18 mmol/L (ref 1.15–1.40)
HCT: 23 % — ABNORMAL LOW (ref 36.0–46.0)
Hemoglobin: 7.8 g/dL — ABNORMAL LOW (ref 12.0–15.0)
O2 Saturation: 100 %
Patient temperature: 98.8
Potassium: 3.1 mmol/L — ABNORMAL LOW (ref 3.5–5.1)
Sodium: 146 mmol/L — ABNORMAL HIGH (ref 135–145)
TCO2: 20 mmol/L — ABNORMAL LOW (ref 22–32)
pCO2 arterial: 29.2 mmHg — ABNORMAL LOW (ref 32.0–48.0)
pH, Arterial: 7.43 (ref 7.350–7.450)
pO2, Arterial: 171 mmHg — ABNORMAL HIGH (ref 83.0–108.0)

## 2019-01-01 LAB — GLUCOSE, CAPILLARY
Glucose-Capillary: 102 mg/dL — ABNORMAL HIGH (ref 70–99)
Glucose-Capillary: 141 mg/dL — ABNORMAL HIGH (ref 70–99)
Glucose-Capillary: 66 mg/dL — ABNORMAL LOW (ref 70–99)
Glucose-Capillary: 86 mg/dL (ref 70–99)
Glucose-Capillary: 95 mg/dL (ref 70–99)

## 2019-01-01 LAB — POCT I-STAT, CHEM 8
BUN: 10 mg/dL (ref 8–23)
Calcium, Ion: 1.19 mmol/L (ref 1.15–1.40)
Chloride: 111 mmol/L (ref 98–111)
Creatinine, Ser: 0.4 mg/dL — ABNORMAL LOW (ref 0.44–1.00)
Glucose, Bld: 103 mg/dL — ABNORMAL HIGH (ref 70–99)
HCT: 26 % — ABNORMAL LOW (ref 36.0–46.0)
Hemoglobin: 8.8 g/dL — ABNORMAL LOW (ref 12.0–15.0)
Potassium: 3.6 mmol/L (ref 3.5–5.1)
Sodium: 146 mmol/L — ABNORMAL HIGH (ref 135–145)
TCO2: 24 mmol/L (ref 22–32)

## 2019-01-01 LAB — PHOSPHORUS: Phosphorus: 1.2 mg/dL — ABNORMAL LOW (ref 2.5–4.6)

## 2019-01-01 MED ORDER — POTASSIUM PHOSPHATES 15 MMOLE/5ML IV SOLN
30.0000 mmol | Freq: Once | INTRAVENOUS | Status: AC
  Administered 2019-01-01: 30 mmol via INTRAVENOUS
  Filled 2019-01-01: qty 10

## 2019-01-01 MED ORDER — VITAL HIGH PROTEIN PO LIQD
1000.0000 mL | ORAL | Status: DC
  Administered 2019-01-01 – 2019-01-02 (×2): 1000 mL

## 2019-01-01 MED ORDER — PRO-STAT SUGAR FREE PO LIQD
30.0000 mL | Freq: Two times a day (BID) | ORAL | Status: DC
  Administered 2019-01-01 – 2019-01-02 (×3): 30 mL
  Filled 2019-01-01 (×3): qty 30

## 2019-01-01 MED ORDER — FENTANYL CITRATE (PF) 100 MCG/2ML IJ SOLN
50.0000 ug | INTRAMUSCULAR | Status: DC | PRN
  Administered 2019-01-01: 50 ug via INTRAVENOUS
  Filled 2019-01-01: qty 2

## 2019-01-01 MED ORDER — POTASSIUM CHLORIDE 20 MEQ/15ML (10%) PO SOLN
40.0000 meq | Freq: Once | ORAL | Status: AC
  Administered 2019-01-01: 40 meq
  Filled 2019-01-01: qty 30

## 2019-01-01 MED ORDER — DEXTROSE 50 % IV SOLN
INTRAVENOUS | Status: AC
  Administered 2019-01-01: 12.5 g via INTRAVENOUS
  Filled 2019-01-01: qty 50

## 2019-01-01 MED ORDER — FENTANYL CITRATE (PF) 100 MCG/2ML IJ SOLN
50.0000 ug | INTRAMUSCULAR | Status: DC | PRN
  Administered 2019-01-01 – 2019-01-04 (×2): 100 ug via INTRAVENOUS
  Filled 2019-01-01 (×3): qty 2

## 2019-01-01 MED ORDER — INSULIN ASPART 100 UNIT/ML ~~LOC~~ SOLN
0.0000 [IU] | SUBCUTANEOUS | Status: DC
  Administered 2019-01-04 – 2019-01-06 (×6): 2 [IU] via SUBCUTANEOUS
  Administered 2019-01-07: 1 [IU] via SUBCUTANEOUS
  Administered 2019-01-07 – 2019-01-11 (×8): 2 [IU] via SUBCUTANEOUS

## 2019-01-01 MED ORDER — LEVETIRACETAM IN NACL 500 MG/100ML IV SOLN
500.0000 mg | Freq: Two times a day (BID) | INTRAVENOUS | Status: DC
  Administered 2019-01-01 – 2019-01-05 (×8): 500 mg via INTRAVENOUS
  Filled 2019-01-01 (×8): qty 100

## 2019-01-01 MED ORDER — SODIUM CHLORIDE 0.9 % IV SOLN
2.0000 g | INTRAVENOUS | Status: AC
  Administered 2019-01-01 – 2019-01-05 (×5): 2 g via INTRAVENOUS
  Filled 2019-01-01 (×5): qty 2

## 2019-01-01 MED ORDER — DEXTROSE 50 % IV SOLN: 12.5000 g | INTRAVENOUS | Status: AC

## 2019-01-01 MED ORDER — PANTOPRAZOLE SODIUM 40 MG IV SOLR
40.0000 mg | Freq: Every day | INTRAVENOUS | Status: DC
  Administered 2019-01-01 – 2019-01-06 (×6): 40 mg via INTRAVENOUS
  Filled 2019-01-01 (×5): qty 40

## 2019-01-01 MED ORDER — MAGNESIUM SULFATE 2 GM/50ML IV SOLN
2.0000 g | Freq: Once | INTRAVENOUS | Status: AC
  Administered 2019-01-01: 2 g via INTRAVENOUS
  Filled 2019-01-01: qty 50

## 2019-01-01 NOTE — Progress Notes (Signed)
eLink Physician-Brief Progress Note Patient Name: Jasmine [name] DOB: [birth year] MRN: [id number]   Date of Service  01/01/2019  HPI/Events of Note  Patient waking up slowly, on vent.  eICU Interventions  Will order PRN fentanyl pushes for light sedation to target RASS -1.     Intervention Category Intermediate Interventions: Other:  Charlott Rakes 01/01/2019, 1:17 AM

## 2019-01-01 NOTE — Consult Note (Addendum)
NAME:  Jasmine [name], MRN:  [id number], DOB:  1955-12-21, LOS: 2 ADMISSION DATE:  12/29/2018, CONSULTATION DATE:  01/01/19 REFERRING MD:  Zada Finders, CHIEF COMPLAINT:  Comatose   Brief History   63 year old woman with a history of possible dementia presenting with progressive neurological decline found to have large hemorrhagic right sided intracranial mass status post craniotomy and persistent respiratory failure postop.  History of present illness   64 year old woman with a history of possible dementia presenting with progressive neurological decline found to have large hemorrhagic right sided intracranial mass status post craniotomy and persistent respiratory failure postop.  After discussion with husband regarding poor prognosis, patient was taken to the operating room for debulking, hematoma removal, and EVD placement by Dr. Zada Finders on 01/09/2019.  She remains on the ventilator postop and PCCM consulted to help manage.  Past Medical History  Dementia versus enlarging intracranial mass  Significant Hospital Events   01/01/2019 craniotomy and right intracranial tumor resection  Consults:  PCCM  Procedures:  See significant Hospital events  Significant Diagnostic Tests:  CT head  IMPRESSION: Hemorrhagic mass which appears to be centered within the right thalamus with significant mass effect upon the third ventricle with obstructive hydrocephalus and transependymal edema. 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 Glioblastoma multiforme deserves consideration but felt to be less likely. MRI with contrast material is recommended. Urgent neurosurgical consultation is recommended.  MRI brain postop IMPRESSION: Postop right-sided craniotomy for debulking of tumor and hematoma in the right thalamus. Large mass lesion is present in the right thalamus with surrounding edema and mass-effect on the midbrain. No other  enhancing mass lesions are identified in the brain. 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 Small areas of acute infarct bilaterally as above.  CT C/A/P IMPRESSION: 1. The left perihilar opacity noted on the current chest radiograph is due to left lower lobe consolidation, which has the appearance of pneumonia as opposed to a mass. In addition, there are other areas of peribronchovascular ground-glass opacity, in both lower lobes and minimally in the posterior left upper lobe. 2. No lung masses or nodules. No evidence metastatic disease or primary malignancy in the chest. 3. No evidence of metastatic disease or primary malignancy in the abdomen or pelvis. 4. No acute findings in the abdomen or pelvis. 5. Moderate increased colonic stool burden and moderate marked distention of the rectum with stool.   Micro Data:  COVID Neg Blood cultures x 2 >>  Antimicrobials:  None   Interim history/subjective:  Consulted, patient obtunded on vent.  Objective   Blood pressure 101/71, pulse 99, temperature 98.6 F (37 C), resp. rate 17, height 5\' 6"  (1.676 m), weight 39.6 kg, SpO2 100 %.    Vent Mode: CPAP;PSV FiO2 (%):  [40 %] 40 % Set Rate:  [10 bmp] 10 bmp Vt Set:  [470 mL] 470 mL PEEP:  [5 cmH20] 5 cmH20 Pressure Support:  [10 cmH20] 10 cmH20 Plateau Pressure:  [8 cmH20-19 cmH20] 8 cmH20   Intake/Output Summary (Last 24 hours) at 01/01/2019 0953 Last data filed at 01/01/2019 0900 Gross per 24 hour  Intake 5362.97 ml  Output 1476 ml  Net 3886.97 ml   Filed Weights   12/21/2018 1901 12/28/2018 0045  Weight: 41.8 kg 39.6 kg    Examination: General: Ill-appearing middle-aged woman on ventilator HENT: EVD in place  with small amount of serosanguineous output, cranial dressing in place with no strikethrough Lungs: Scattered rhonchi, no accessory muscle use, triggering  vent Cardiovascular: Heart sounds are regular, extremities are warm Abdomen: Abdomen is soft with hypoactive bowel sounds Extremities: There is no edema, she does have muscle wasting Neuro: I can get her withdrawal in the left lower extremity, no motor response in the others 3 extremities, she has a fixed upward right gaze in both eyes.  Nurse this AM was able to get her to withdraw in lower ext bilaterally but nothing in upper ext.  She will open eyes to pain. Skin: no rashes, sacral ulcer per nursing documentation  Resolved Hospital Problem list     Assessment & Plan:  # Postoperative respiratory failure related to inability to protect airway and likely aspiration # Hemorrhagic intracranial mass with pathology pending, status post debulking, residual tumor in the brainstem # Abnormal CT chest likely representing aspiration rather than bronchogenic carcinoma although cannot rule out latter # Progressive dementia that was probably more related to intracranial mass  # Severe protein calorie malnutrition present on admission # Stage 3 pressure ulcer- sacral, present on admission, wound care consult pending  - BP goals, EVD, and prognosticating per primary - f/u surgical path - check spot EEG r/o status, low threshold to start AEDs depending on result - 5 days ceftriaxone for probably aspiration pneumonia - check ABG, CXR - mental status precludes extubation at this time - Very poor prognosis, will consult palliative care to establish relationship and determine how far family wants to push this  Best practice:  Diet: Start TF Pain/Anxiety/Delirium protocol (if indicated): Propofol, PRN fentanyl VAP protocol (if indicated): In place DVT prophylaxis: start SCDs GI prophylaxis: start PPI Glucose control: Start SSI Mobility: BR Code Status: Full Family Communication: Per primary Disposition: ICU  Labs   CBC: Recent Labs  Lab 01/18/2019 1904 12/26/2018 1905 01/17/2019 1046  WBC 15.0*   --   --   NEUTROABS 11.8*  --   --   HGB 13.4 14.3 8.8*  HCT 41.2 42.0 26.0*  MCV 97.9  --   --   PLT 354  --   --     Basic Metabolic Panel: Recent Labs  Lab 01/11/2019 1904 01/19/2019 1905 01/09/2019 1046  NA 139 139 146*  K 4.4 4.4 3.6  CL 103 103 111  CO2 26  --   --   GLUCOSE 136* 129* 103*  BUN 15 14 10   CREATININE 0.45 0.50 0.40*  CALCIUM 9.1  --   --    GFR: Estimated Creatinine Clearance: 45 mL/min (A) (by C-G formula based on SCr of 0.4 mg/dL (L)). Recent Labs  Lab 12/23/2018 1904 01/12/2019 2005  WBC 15.0*  --   LATICACIDVEN  --  1.2    Liver Function Tests: Recent Labs  Lab 01/19/2019 1904  AST 35  ALT 27  ALKPHOS 81  BILITOT 0.5  PROT 7.1  ALBUMIN 3.0*   No results for input(s): LIPASE, AMYLASE in the last 168 hours. No results for input(s): AMMONIA in the last 168 hours.  ABG    Component Value Date/Time   PHART 7.561 (H) 01/17/2019 2037   PCO2ART 27.5 (L) 01/11/2019 2037   PO2ART 362 (H) 12/28/2018 2037   HCO3 NOT CALCULATED 12/26/2018 2037   TCO2 24 12/21/2018 1046   O2SAT >100.0 12/20/2018 2037     Coagulation Profile: Recent Labs  Lab 01/19/2019 0331  INR 1.2    Cardiac Enzymes: No  results for input(s): CKTOTAL, CKMB, CKMBINDEX, TROPONINI in the last 168 hours.  HbA1C: No results found for: HGBA1C  CBG: Recent Labs  Lab 01/14/2019 1857  GLUCAP 125*    Review of Systems:   Cannot assess, obtunded  Past Medical History  She,  has a past medical history of Anxiety, Arm fracture, Dementia ([place]), and Depression.   Surgical History    Past Surgical History:  Procedure Laterality Date  . arm surgery Right    Fx, steel pin   . BACK SURGERY     "burning of nerve due to constant back pain 2002  . BREAST ENHANCEMENT SURGERY     removed 2017  . UTERINE FIBROID SURGERY  2017     Social History   reports that she has never smoked. She has never used smokeless tobacco. She reports current alcohol use. She reports that she does not  use drugs.   Family History   Her family history is not on file.   Allergies Allergies  Allergen Reactions  . Shellfish Allergy Anaphylaxis  . Lexapro [Escitalopram Oxalate] Rash  . Sulfa Antibiotics Rash     Home Medications  Prior to Admission medications   Medication Sig Start Date End Date Taking? Authorizing Provider  cholecalciferol (VITAMIN D3) 25 MCG (1000 UT) tablet Take 1,000 Units by mouth daily.   Yes [provider]  divalproex (DEPAKOTE) 500 MG DR tablet Take 1 tablet (500 mg total) by mouth 2 (two) times daily. Patient taking differently: Take 500 mg by mouth every morning.  03/09/18  Yes Penumalli, Earlean Polka, MD     Critical care time: 32 minutes not including any separately billable procedures

## 2019-01-01 NOTE — Procedures (Signed)
Patient Name: Jasmine [name]  MRN: [id number]  Epilepsy Attending: Lora Havens  Referring Physician/Provider: Dr Ina Homes Date: 01/01/2019 Duration: 27.12 mins  Patient history: 63yo m with right sided hemorrhagic intracranial mass s/p craniotomy. EEG to evaluate for seizure  Level of alertness: comatose  AEDs during EEG study: None  Technical aspects: This EEG study was done with scalp electrodes positioned according to the 10-20 International system of electrode placement. Electrical activity was acquired at a sampling rate of 500Hz  and reviewed with a high frequency filter of 70Hz  and a low frequency filter of 1Hz . EEG data were recorded continuously and digitally stored.   DESCRIPTION: EEG showed continuous generalized 2-5hz  theta-delta slowing. Frequent spikes were also seen in right posterior quadrant. Hyperventilation and photic stimulation were not performed.  ABNORMALITY - Continuous slow, generalized - Spikes, right posterior quadrant  IMPRESSION: This study showed evidence of epileptogenicity in right posterior quadrant. There is also profound diffuse encephalopathy, non specific to etiology.  No seizures were seen throughout the recording.  Alaine Loughney Barbra Sarks

## 2019-01-01 NOTE — Progress Notes (Signed)
EEG complete - results pending 

## 2019-01-01 NOTE — Progress Notes (Signed)
Neurosurgery Service Progress Note  Subjective: No acute events overnight   Objective: Vitals:   01/01/19 0730 01/01/19 0745 01/01/19 0800 01/01/19 0900  BP:   122/86 101/71  Pulse: 95 95 94 99  Resp: (!) 22 16 19 17   Temp: 98.2 F (36.8 C) 98.2 F (36.8 C) 98.4 F (36.9 C) 98.6 F (37 C)  SpO2: 100% 100% 100% 100%  Weight:      Height:       Temp (24hrs), Avg:97.7 F (36.5 C), Min:95.5 F (35.3 C), Max:100.6 F (38.1 C)  CBC Latest Ref Rng & Units 12/26/2018 01/15/2019 01/13/2019  WBC 4.0 - 10.5 K/uL - - 15.0(H)  Hemoglobin 12.0 - 15.0 g/dL 1.6(X) 09.6 04.5  Hematocrit 36.0 - 46.0 % 26.0(L) 42.0 41.2  Platelets 150 - 400 K/uL - - 354   BMP Latest Ref Rng & Units 01/04/2019 01/09/2019 12/26/2018  Glucose 70 - 99 mg/dL 409(W) 119(J) 478(G)  BUN 8 - 23 mg/dL 10 14 15   Creatinine 0.44 - 1.00 mg/dL 9.56(O) 1.30 8.65  BUN/Creat Ratio 12 - 28 - - -  Sodium 135 - 145 mmol/L 146(H) 139 139  Potassium 3.5 - 5.1 mmol/L 3.6 4.4 4.4  Chloride 98 - 111 mmol/L 111 103 103  CO2 22 - 32 mmol/L - - 26  Calcium 8.9 - 10.3 mg/dL - - 9.1    Intake/Output Summary (Last 24 hours) at 01/01/2019 0928 Last data filed at 01/01/2019 0900 Gross per 24 hour  Intake 5362.97 ml  Output 1476 ml  Net 3886.97 ml    Current Facility-Administered Medications:  .  0.9 %  sodium chloride infusion, , Intravenous, Continuous, Shirlean Berman, Clovis Pu, MD, Last Rate: 75 mL/hr at 01/01/19 0900, Rate Verify at 01/01/19 0900 .  acetaminophen (TYLENOL) tablet 650 mg, 650 mg, Oral, Q4H PRN **OR** acetaminophen (TYLENOL) suppository 650 mg, 650 mg, Rectal, Q4H PRN, Charlottie Peragine A, MD .  chlorhexidine gluconate (MEDLINE KIT) (PERIDEX) 0.12 % solution 15 mL, 15 mL, Mouth Rinse, BID, Gillian Kluever, Clovis Pu, MD, 15 mL at 01/01/19 0726 .  Chlorhexidine Gluconate Cloth 2 % PADS 6 each, 6 each, Topical, Daily, Jadene Pierini, MD, 6 each at 01/08/2019 0845 .  fentaNYL (SUBLIMAZE) injection 50 mcg, 50 mcg,  Intravenous, Q15 min PRN, Stretch, Marveen Reeks, MD, 50 mcg at 01/01/19 0244 .  fentaNYL (SUBLIMAZE) injection 50-200 mcg, 50-200 mcg, Intravenous, Q30 min PRN, Stretch, Marveen Reeks, MD, 100 mcg at 01/01/19 0440 .  labetalol (NORMODYNE) injection 10-40 mg, 10-40 mg, Intravenous, Q10 min PRN, Jadene Pierini, MD .  MEDLINE mouth rinse, 15 mL, Mouth Rinse, 10 times per day, Jadene Pierini, MD, 15 mL at 01/01/19 7846 .  ondansetron (ZOFRAN) tablet 4 mg, 4 mg, Oral, Q4H PRN **OR** ondansetron (ZOFRAN) injection 4 mg, 4 mg, Intravenous, Q4H PRN, Ranell Finelli A, MD .  phenylephrine (NEOSYNEPHRINE) 10-0.9 MG/250ML-% infusion, 0-400 mcg/min, Intravenous, Titrated, Aubrea Meixner A, MD, Last Rate: 120 mL/hr at 01/01/19 0900, 80 mcg/min at 01/01/19 0900 .  polyethylene glycol (MIRALAX / GLYCOLAX) packet 17 g, 17 g, Oral, Daily PRN, Jadene Pierini, MD .  promethazine (PHENERGAN) tablet 12.5-25 mg, 12.5-25 mg, Oral, Q4H PRN, Jadene Pierini, MD .  propofol (DIPRIVAN) 1000 MG/100ML infusion, 5-80 mcg/kg/min, Intravenous, Titrated, Christeen Lai, Clovis Pu, MD, Stopped at 01/07/2019 0200   Physical Exam: Intubated, eyes opening to stimulating on left, pupils 3-->2, +R 4th nerve palsy, neutral on left, not FC, w/d x4 with some spontaneous movement.  Assessment & Plan:  63 y.o. 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 12/12 post-op MRI w/ expected residual tumor in brainstem, good debulking, scattered areas of diffusion changes including posterior pons likely 2/2 hemorrhage event, unclear pattern.  -still intubated and requiring small dose of phenylepherine, will c/s CCM to assist with critical care needs -CT CAP showed that hilar mass was consolidation, no evidence of metastatic disease, presumed  primary brain tumor -R 4th likely 2/2 ischemic area in brainstem, will have to see how her mental status improves as she recovers, unclear at this point whether or not the tumor was the cause of her progressive cognitive decline -post op MRI w/ significant subdural air and collapse of R hemisphere w/ some SDH, raised EVD to prevent expansion and allow restoration of collapse-related shift -keep EVD at +15, likely will have low output -SCDs/TEDs, SQH on POD2 -will update pt's husband when able  Jadene Pierini  01/01/19 9:28 AM

## 2019-01-01 NOTE — Progress Notes (Signed)
PMT consult received and chart reviewed in detail. Discussed with RN. Patient assessment completed. No family at bedside. PMT provider will attempt to meet with husband in person tomorrow for goals of care discussion. Thank you.   NO CHARGE  Ihor Dow, Sierra Brooks, FNP-C Palliative Medicine Team  Phone: [phone] Fax: [phone]

## 2019-01-02 ENCOUNTER — Telehealth: Payer: BC Managed Care – PPO | Admitting: Nurse Practitioner

## 2019-01-02 ENCOUNTER — Inpatient Hospital Stay (HOSPITAL_COMMUNITY): Payer: BLUE CROSS/BLUE SHIELD

## 2019-01-02 ENCOUNTER — Encounter: Payer: Self-pay | Admitting: *Deleted

## 2019-01-02 DIAGNOSIS — F0390 Unspecified dementia without behavioral disturbance: Secondary | ICD-10-CM

## 2019-01-02 DIAGNOSIS — J9601 Acute respiratory failure with hypoxia: Secondary | ICD-10-CM

## 2019-01-02 DIAGNOSIS — Z515 Encounter for palliative care: Secondary | ICD-10-CM

## 2019-01-02 DIAGNOSIS — J9690 Respiratory failure, unspecified, unspecified whether with hypoxia or hypercapnia: Secondary | ICD-10-CM

## 2019-01-02 DIAGNOSIS — Z7189 Other specified counseling: Secondary | ICD-10-CM

## 2019-01-02 DIAGNOSIS — J9600 Acute respiratory failure, unspecified whether with hypoxia or hypercapnia: Secondary | ICD-10-CM

## 2019-01-02 LAB — GLUCOSE, CAPILLARY
Glucose-Capillary: 97 mg/dL (ref 70–99)
Glucose-Capillary: 109 mg/dL — ABNORMAL HIGH (ref 70–99)
Glucose-Capillary: 97 mg/dL (ref 70–99)
Glucose-Capillary: 96 mg/dL (ref 70–99)
Glucose-Capillary: 81 mg/dL (ref 70–99)
Glucose-Capillary: 114 mg/dL — ABNORMAL HIGH (ref 70–99)

## 2019-01-02 LAB — BASIC METABOLIC PANEL
Anion gap: 7 (ref 5–15)
BUN: 11 mg/dL (ref 8–23)
CO2: 18 mmol/L — ABNORMAL LOW (ref 22–32)
Calcium: 7.5 mg/dL — ABNORMAL LOW (ref 8.9–10.3)
Chloride: 115 mmol/L — ABNORMAL HIGH (ref 98–111)
Creatinine, Ser: <0.3 mg/dL — ABNORMAL LOW (ref 0.44–1.00)
Glucose, Bld: 119 mg/dL — ABNORMAL HIGH (ref 70–99)
Potassium: 3.8 mmol/L (ref 3.5–5.1)
Sodium: 140 mmol/L (ref 135–145)

## 2019-01-02 LAB — CBC
HCT: 24.6 % — ABNORMAL LOW (ref 36.0–46.0)
Hemoglobin: 8.1 g/dL — ABNORMAL LOW (ref 12.0–15.0)
MCH: 32.3 pg (ref 26.0–34.0)
MCHC: 32.9 g/dL (ref 30.0–36.0)
MCV: 98 fL (ref 80.0–100.0)
Platelets: 197 10*3/uL (ref 150–400)
RBC: 2.51 MIL/uL — ABNORMAL LOW (ref 3.87–5.11)
RDW: 14 % (ref 11.5–15.5)
WBC: 16.4 10*3/uL — ABNORMAL HIGH (ref 4.0–10.5)
nRBC: 0 % (ref 0.0–0.2)

## 2019-01-02 LAB — PHOSPHORUS
Phosphorus: 1.9 mg/dL — ABNORMAL LOW (ref 2.5–4.6)
Phosphorus: 2 mg/dL — ABNORMAL LOW (ref 2.5–4.6)

## 2019-01-02 LAB — MAGNESIUM
Magnesium: 2 mg/dL (ref 1.7–2.4)
Magnesium: 1.8 mg/dL (ref 1.7–2.4)

## 2019-01-02 MED ORDER — OSMOLITE 1.2 CAL PO LIQD
1000.0000 mL | ORAL | Status: DC
  Administered 2019-01-02 – 2019-01-11 (×9): 1000 mL
  Filled 2019-01-02 (×14): qty 1000

## 2019-01-02 MED ORDER — ADULT MULTIVITAMIN W/MINERALS CH
1.0000 | ORAL_TABLET | Freq: Every day | ORAL | Status: DC
  Administered 2019-01-02 – 2019-01-12 (×11): 1
  Filled 2019-01-02 (×10): qty 1

## 2019-01-02 MED ORDER — HEPARIN SODIUM (PORCINE) 5000 UNIT/ML IJ SOLN
5000.0000 [IU] | Freq: Three times a day (TID) | INTRAMUSCULAR | Status: DC
  Administered 2019-01-02 – 2019-01-12 (×32): 5000 [IU] via SUBCUTANEOUS
  Filled 2019-01-02 (×31): qty 1

## 2019-01-02 NOTE — Progress Notes (Signed)
OT Cancellation Note  Patient Details Name: Loreatha Bacco MRN: [id number] DOB: 1956-01-08   Cancelled Treatment:    Reason Eval/Treat Not Completed: Patient not medically ready.  Pt currently intubated, and awaiting palliative consult.  Lucille Passy, OTR/L Acute Rehabilitation Services Pager [phone] Office [phone]   Lucille Passy M 01/02/2019, 11:14 AM

## 2019-01-02 NOTE — Progress Notes (Signed)
PT Cancellation Note  Patient Details Name: Markeita Truchan MRN: [id number] DOB: April 30, 1955   Cancelled Treatment:    Reason Eval/Treat Not Completed: Patient not medically ready.  Pt presently intubated and obtunded.  Awaiting palliative consult. 01/02/2019  Donnella Sham, [place] [phone]  (pager) [phone]  (office)   Tessie Fass Barba Solt 01/02/2019, 12:39 PM

## 2019-01-02 NOTE — Progress Notes (Signed)
Nutrition Follow-up  DOCUMENTATION CODES:   Severe malnutrition in context of chronic illness  INTERVENTION:   Tube Feeding: Osmolite 1.2 at 55 ml/hr; Begin at 20 ml/hr, titrate by 10 mL q 12 hours until goal rate of 55 ml/hr via OG tube  Provides 73 g of protein, 1584 kcals and 1069 mL of free water Meets 100% estimated calorie and protein needs  Monitor magnesium, potassium, and phosphorus for at least 5 occurences, MD to replete as needed, as pt is at risk for refeeding syndrome given underweight BMI 14 and severe malnutrition.  Once at goal will add Juven BID for wounds   NUTRITION DIAGNOSIS:   Severe Malnutrition related to chronic illness(brain cancer) as evidenced by severe muscle depletion, severe fat depletion.  Ongoing.   GOAL:   Patient will meet greater than or equal to 90% of their needs  Progressing.   MONITOR:   Vent status, TF tolerance, Skin  REASON FOR ASSESSMENT:   Ventilator    ASSESSMENT:   Pt with PMH of depression, anxiety, progressive dementia now severe (dx 2017), and epilepsy and admitted with acute obtunded state at home. Per husband pt was bedbound, not using her left side, and had poor oral intake.   CT shows large R hemorrhagic hemispheric mass invading thalamus with associated hemorrhage and hydrocephalus.  12/12 s/p craniotomy for R tumor debulking and hematoma evacuation.  12/13 TF: Vital High Protein @ 40 ml/hr and 30 ml Prostat BID via OG tube   Being treated for LLL PNA Palliative care following.  Noted pt with stage III on her buttocks which was present on admission.   Patient is currently intubated on ventilator support MV: 6.8 L/min Temp (24hrs), Avg:98 F (36.7 C), Min:96.6 F (35.9 C), Max:99.1 F (37.3 C)  Medications reviewed and include: PO4: 1.9 (L) Neo @ 50     NUTRITION - FOCUSED PHYSICAL EXAM:    Most Recent Value  Orbital Region  Severe depletion  Upper Arm Region  Moderate depletion  Thoracic and  Lumbar Region  Severe depletion  Buccal Region  Severe depletion  Temple Region  Severe depletion  Clavicle Bone Region  Severe depletion  Clavicle and Acromion Bone Region  Severe depletion  Scapular Bone Region  Unable to assess  Dorsal Hand  Moderate depletion  Patellar Region  Moderate depletion  Anterior Thigh Region  Moderate depletion  Posterior Calf Region  Unable to assess  Edema (RD Assessment)  None  Hair  Reviewed  Eyes  Unable to assess  Mouth  Unable to assess  Skin  Reviewed  Nails  Reviewed       Diet Order:   Diet Order            Diet NPO time specified  Diet effective now              EDUCATION NEEDS:   Not appropriate for education at this time  Skin:  Skin Assessment: Skin Integrity Issues: Skin Integrity Issues:: Stage III, DTI DTI: hip Stage III: buttocks  Last BM:  no documented BM  Height:   Ht Readings from Last 1 Encounters:  01/07/2019 5\' 6"  (1.676 m)    Weight:   Wt Readings from Last 1 Encounters:  01/02/19 38.8 kg    Ideal Body Weight:  59 kg  BMI:  Body mass index is 13.81 kg/m.  Estimated Nutritional Needs:   Kcal:  1400-1600 kcals  Protein:  70-80  Fluid:  >/= 1.4 L  Maylon Peppers RD,  [place], [place] Pager [phone] After Hours Pager

## 2019-01-02 NOTE — Progress Notes (Signed)
NAME:  Jasmine [name], MRN:  [id number], DOB:  Sep 25, 1955, LOS: 3 ADMISSION DATE:  01/17/2019, CONSULTATION DATE:  01/01/19 REFERRING MD:  Zada Finders, CHIEF COMPLAINT:  Comatose   Brief History   63 year old woman with a history of possible dementia presenting with progressive neurological decline found to have large hemorrhagic right sided intracranial mass status post craniotomy and persistent respiratory failure postop.  History of present illness   63 year old woman with a history of possible dementia presenting with progressive neurological decline found to have large hemorrhagic right sided intracranial mass status post craniotomy and persistent respiratory failure postop.  After discussion with husband regarding poor prognosis, patient was taken to the operating room for debulking, hematoma removal, and EVD placement by Dr. Zada Finders on 12/22/2018.  She remains on the ventilator postop and PCCM consulted to help manage.  Past Medical History  Dementia versus enlarging intracranial mass  Significant Hospital Events   12/27/2018 craniotomy and right intracranial tumor resection  Consults:  PCCM Palliative Care  Procedures:  See significant Hospital events  Significant Diagnostic Tests:  CT head  IMPRESSION: Hemorrhagic mass which appears to be centered within the right thalamus with significant mass effect upon the third ventricle with obstructive hydrocephalus and transependymal edema. 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 Glioblastoma multiforme deserves consideration but felt to be less likely. MRI with contrast material is recommended. Urgent neurosurgical consultation is recommended.  MRI brain postop IMPRESSION: Postop right-sided craniotomy for debulking of tumor and hematoma in the right thalamus. Large mass lesion is present in the right thalamus with surrounding edema and mass-effect on the  midbrain. No other enhancing mass lesions are identified in the brain. 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 Small areas of acute infarct bilaterally as above.  CT C/A/P IMPRESSION: 1. The left perihilar opacity noted on the current chest radiograph is due to left lower lobe consolidation, which has the appearance of pneumonia as opposed to a mass. In addition, there are other areas of peribronchovascular ground-glass opacity, in both lower lobes and minimally in the posterior left upper lobe. 2. No lung masses or nodules. No evidence metastatic disease or primary malignancy in the chest. 3. No evidence of metastatic disease or primary malignancy in the abdomen or pelvis. 4. No acute findings in the abdomen or pelvis. 5. Moderate increased colonic stool burden and moderate marked distention of the rectum with stool.   Micro Data:  COVID Neg Blood cultures x 2 >>  Antimicrobials:  Ceftriaxone 12/13 >>   Interim history/subjective:  No sedation given for over 24h per RN report this am  Objective   Blood pressure 120/83, pulse 74, temperature (!) 96.8 F (36 C), resp. rate (!) 21, height 5\' 6"  (1.676 m), weight 38.8 kg, SpO2 100 %.    Vent Mode: PSV;CPAP FiO2 (%):  [40 %] 40 % Set Rate:  [10 bmp] 10 bmp Vt Set:  [470 mL] 470 mL PEEP:  [5 cmH20] 5 cmH20 Pressure Support:  [10 cmH20-15 cmH20] 15 cmH20 Plateau Pressure:  [13 cmH20-25 cmH20] 24 cmH20   Intake/Output Summary (Last 24 hours) at 01/02/2019 1014 Last data filed at 01/02/2019 1000 Gross per 24 hour  Intake 5394.97 ml  Output 1486 ml  Net 3908.97 ml   Filed Weights   01/14/2019 1901 01/15/2019 0045 01/02/19 0500  Weight: 41.8 kg 39.6  kg 38.8 kg    Examination: General: Acute and chronically ill woman, ventilated HENT: Ventricular drain in place, staples clean and dry, ET tube in place Lungs:  Coarse bilaterally, tolerating PSV 15 Cardiovascular: Regular, no murmur Abdomen: Soft and nondistended, positive bowel sounds Extremities: Some muscle wasting, no edema Neuro: Withdraws left leg, left eye is spontaneously open, does not open the right eye to pain, voice, does not track.  Does not follow any commands Skin: No rash, sacral decubitus ulcer (WOC following)  Resolved Hospital Problem list     Assessment & Plan:  # Postoperative respiratory failure related to inability to protect airway and likely aspiration -Acute encephalopathy superimposed on her chronic dementia influencing ability to extubate.  She does not have the airway protection or mental status currently to tolerate -Continue PSV and follow mental status  # Hemorrhagic intracranial mass with pathology pending, status post debulking, residual tumor in the brainstem -Minimizing sedation -Ventricular drain management as per neurosurgery -On Depakote at home, currently on Keppra -Prognosis here is poor per Dr. Zada Finders.  Palliative care consulted and plan to meet with family -EEG without any evidence of active seizures, consistent with encephalopathy -Repeat head CT planned for 12/14 -Surgical pathology is pending  # Abnormal CT chest consistent with left lower lobe pneumonia.  -No clear evidence of mass lesion on my review of CT chest although remains possible.  Depending on prognosis would need to reimage chest to evaluate for clearance of infiltrate/lesion with antibiotic therapy -Ceftriaxone as ordered, plan for 5 days  # Shock, suspect sedation + sepsis due to LLL PNA -Wean phenylephrine as able -Minimize sedating medication  # Progressive dementia that was probably more related to intracranial mass -Unclear what her mental status, ability to wake up will be post procedure.  Minimize sedation  # Severe protein calorie malnutrition present on admission -Tube feeding for nutrition  # Stage 3 pressure  ulcer- sacral, present on admission -Appreciate WOC evaluation   Best practice:  Diet: TF Pain/Anxiety/Delirium protocol (if indicated): fentanyl prn VAP protocol (if indicated): In place DVT prophylaxis: start SCDs GI prophylaxis: PPI Glucose control: SSI Mobility: BR Code Status: Full Family Communication: discussions undertaken by Dr Zada Finders, Palliative Care meeting pending  Disposition: ICU  Labs   CBC: Recent Labs  Lab 12/22/2018 1904 01/06/2019 1905 01/03/2019 1046 01/01/19 1101 01/02/19 0322 01/02/19 0356  WBC 15.0*  --   --   --  QUESTIONABLE RESULTS, RECOMMEND RECOLLECT TO VERIFY 16.4*  NEUTROABS 11.8*  --   --   --   --   --   HGB 13.4 14.3 8.8* 7.8* QUESTIONABLE RESULTS, RECOMMEND RECOLLECT TO VERIFY 8.1*  HCT 41.2 42.0 26.0* 23.0* QUESTIONABLE RESULTS, RECOMMEND RECOLLECT TO VERIFY 24.6*  MCV 97.9  --   --   --  QUESTIONABLE RESULTS, RECOMMEND RECOLLECT TO VERIFY 98.0  PLT 354  --   --   --  QUESTIONABLE RESULTS, RECOMMEND RECOLLECT TO VERIFY [id number]    Basic Metabolic Panel: Recent Labs  Lab 01/15/2019 1904 12/29/2018 1905 01/14/2019 1046 01/01/19 1101 01/01/19 1258 01/02/19 0322 01/02/19 0356 01/02/19 0410  NA 139 139 146* 146*  --  QUESTIONABLE RESULTS, RECOMMEND RECOLLECT TO VERIFY 140  --   K 4.4 4.4 3.6 3.1*  --  QUESTIONABLE RESULTS, RECOMMEND RECOLLECT TO VERIFY 3.8  --   CL 103 103 111  --   --  QUESTIONABLE RESULTS, RECOMMEND RECOLLECT TO VERIFY 115*  --   CO2 26  --   --   --   --  QUESTIONABLE RESULTS, RECOMMEND RECOLLECT TO VERIFY 18*  --   GLUCOSE 136* 129* 103*  --   --  QUESTIONABLE RESULTS, RECOMMEND RECOLLECT TO VERIFY 119*  --   BUN 15 14 10   --   --  QUESTIONABLE RESULTS, RECOMMEND RECOLLECT TO VERIFY 11  --   CREATININE 0.45 0.50 0.40*  --   --  QUESTIONABLE RESULTS, RECOMMEND RECOLLECT TO VERIFY <0.30*  --   CALCIUM 9.1  --   --   --   --  QUESTIONABLE RESULTS, RECOMMEND RECOLLECT TO VERIFY 7.5*  --   MG  --   --   --   --  1.7 QUESTIONABLE  RESULTS, RECOMMEND RECOLLECT TO VERIFY  --  2.0  PHOS  --   --   --   --  1.2* QUESTIONABLE RESULTS, RECOMMEND RECOLLECT TO VERIFY  --  1.9*   GFR: CrCl cannot be calculated (This lab value cannot be used to calculate CrCl because it is not a number: <0.30). Recent Labs  Lab 01/18/2019 1904 12/25/2018 2005 01/02/19 0322 01/02/19 0356  WBC 15.0*  --  QUESTIONABLE RESULTS, RECOMMEND RECOLLECT TO VERIFY 16.4*  LATICACIDVEN  --  1.2  --   --     Liver Function Tests: Recent Labs  Lab 12/29/2018 1904  AST 35  ALT 27  ALKPHOS 81  BILITOT 0.5  PROT 7.1  ALBUMIN 3.0*   No results for input(s): LIPASE, AMYLASE in the last 168 hours. No results for input(s): AMMONIA in the last 168 hours.  ABG    Component Value Date/Time   PHART 7.430 01/01/2019 1101   PCO2ART 29.2 (L) 01/01/2019 1101   PO2ART 171.0 (H) 01/01/2019 1101   HCO3 19.3 (L) 01/01/2019 1101   TCO2 20 (L) 01/01/2019 1101   ACIDBASEDEF 4.0 (H) 01/01/2019 1101   O2SAT 100.0 01/01/2019 1101     Coagulation Profile: Recent Labs  Lab 12/30/2018 0331  INR 1.2    Cardiac Enzymes: No results for input(s): CKTOTAL, CKMB, CKMBINDEX, TROPONINI in the last 168 hours.  HbA1C: No results found for: HGBA1C  CBG: Recent Labs  Lab 01/01/19 1543 01/01/19 1953 01/01/19 2335 01/02/19 0328 01/02/19 0741  GLUCAP 95 102* 86 97 109*    Critical care time: 33 minutes     Baltazar Apo, MD, PhD 01/02/2019, 10:38 AM [facility] [phone] or if no answer [phone]

## 2019-01-02 NOTE — Consult Note (Addendum)
Consultation Note Date: 01/02/2019   Patient Name: Jasmine [name]  DOB: 12-Oct-1955  MRN: [id number]  Age / Sex: 63 y.o., female  PCP: Chevis Pretty, FNP Referring Physician: Judith Part, MD  Reason for Consultation: Establishing goals of care  HPI/Patient Profile: 63 y.o. female  with past medical history of depression, anxiety, progressive dementia, and epilepsy admitted on 01/12/2019 with acute obtunded state at home. CT with large right hemorrhagic hemispheric mass invading thalamus with associated hemorrhage and hydrocephalus. 12/12 left EVD placed and s/p craniotomy for right tumor debulking and hematoma evacuation. Patient remains on ventilator post-op and PCCM following. Abnormal CT chest consistent with left lower lobe pneumonia, receiving ceftriaxone. EEG without evidence of active seizures, consistent with encephalopathy. Prognosis poor with current exam and MRI findings. Palliative medicine consultation for goals of care.   Clinical Assessment and Goals of Care:  I have reviewed medical records, discussed with care team, and assessed the patient at bedside. No family present.   Shortly after, spoke with husband Nikiah Goin) via telephone to discuss goals of care. Warren Lacy works today and unable to meet with PMT provider in person.   I introduced Palliative Medicine as specialized medical care for people living with serious illness. It focuses on providing relief from the symptoms and stress of a serious illness.  We discussed a brief life review of the patient. Warren Lacy describes Virgen as "beautiful, loving, caring, and full of energy." She never worked, but stayed home to care for the children and house. Kenady has 3 children and Warren Lacy has 3 children from previous marriages. Lita and Warren Lacy have been together for over 20 years. "That's my soul mate" and shares that they have the same  birthday.   She was diagnosed with early stages of dementia in 2017. Brenley saw her neurologist in February 2020, and per husband dementia had progressed to "severe."   Discussed events leading up to admission including worsening dementia in the last few months. Bedbound, not using her left side, poor oral intake.   Discussed course of hospitalization including medical diagnoses, interventions, plan of care, and guarded prognosis. Provided Warren Lacy update for today including plan for repeat CTH. Frankly and compassionately shared that the doctors are worried about Kineta's prognosis due to MRI findings, neurological examination, and inability to attempt vent wean/able to protect her airway with ongoing encephalopathy.  I attempted to elicit values and goals of care important to the patient and husband. Advanced directives, concepts specific to code status, artifical feeding and hydration were discussed. Warren Lacy shares that Gayathri "never talked about" her EOL wishes. She has just talked about not wanting to leave him. The difference between aggressive medical intervention and comfort focused care was briefly discussed. Explained that Warren Lacy is faced with big decisions moving forward--including aggressive measures such as peg/trach (leading to nursing home placement) vs comfort focused pathway that would allow Rashada comfort, peace, and nature to take course with her condition.   Warren Lacy is "hoping she will wake up" and "hoping for a miracle." He shares  that they are Christian's. He acknowledges he is faced with "tough decisions" but he plans to "take it one step at a time."   Reassured of ongoing support from palliative providers.     SUMMARY OF RECOMMENDATIONS    Continue FULL code/FULL scope treatment. Pending recommendations and thoughts on prognosis per attending.  Ongoing palliative discussions. Prepared husband that he is faced with big decisions in regards to continuing aggressive medical  management vs. Comfort focused care if she does not show clinical improvement. Husband remains hopeful and "take it one step at a time." PMT will continue to follow and support patient/family.   Code Status/Advance Care Planning:  Full code  Symptom Management:   Per attending  Palliative Prophylaxis:   Aspiration, Delirium Protocol, Oral Care and Turn Reposition  Additional Recommendations (Limitations, Scope, Preferences):  Full Scope Treatment  Psycho-social/Spiritual:   Desire for further Chaplaincy support:yes  Additional Recommendations: Caregiving  Support/Resources, Compassionate Wean Education and Education on Hospice  Prognosis:   Poor prognosis  Discharge Planning: To Be Determined      Primary Diagnoses: Present on Admission: . ICH (intracerebral hemorrhage) ([place])   I have reviewed the medical record, interviewed the patient and family, and examined the patient. The following aspects are pertinent.  Past Medical History:  Diagnosis Date  . Anxiety   . Arm fracture    right, steel pin  . Dementia ([place])    dx 2018  . Depression    Social History   Socioeconomic History  . Marital status: Married    Spouse name: Warren Lacy  . Number of children: 3  . Years of education: Not on file  . 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 Smoking status: Never Smoker  . Smokeless tobacco: Never Used  Substance and Sexual Activity  . Alcohol use: Yes    Comment: social, 1/2 glass  . Drug use: No  . Sexual activity: Not on file  Other Topics Concern  . Not on file  Social History Narrative   Lives with husband, son   Caffeine- 1 cup daily   Social Determinants of Health   Financial Resource Strain:   . Difficulty of Paying Living Expenses: Not on file  Food Insecurity:   . Worried About Charity fundraiser in the Last Year: Not on file  . Ran Out of Food in the  Last Year: Not on file  Transportation Needs:   . Lack of Transportation (Medical): Not on file  . Lack of Transportation (Non-Medical): Not on file  Physical Activity:   . Days of Exercise per Week: Not on file  . Minutes of Exercise per Session: Not on file  Stress:   . Feeling of Stress : Not on file  Social Connections:   . Frequency of Communication with Friends and Family: Not on file  . Frequency of Social Gatherings with Friends and Family: Not on file  . Attends Religious Services: Not on file  . Active Member of Clubs or Organizations: Not on file  . Attends Archivist Meetings: Not on file  . Marital Status: Not on file   History reviewed. No pertinent family history. Scheduled Meds: . chlorhexidine gluconate (MEDLINE KIT)  15 mL Mouth Rinse BID  . Chlorhexidine Gluconate Cloth  6 each Topical Daily  . feeding supplement (PRO-STAT SUGAR FREE 64)  30 mL Per Tube BID  . feeding supplement (  VITAL HIGH PROTEIN)  1,000 mL Per Tube Q24H  . heparin injection (subcutaneous)  5,000 Units Subcutaneous Q8H  . insulin aspart  0-24 Units Subcutaneous Q4H  . mouth rinse  15 mL Mouth Rinse 10 times per day  . pantoprazole (PROTONIX) IV  40 mg Intravenous QHS   Continuous Infusions: . sodium chloride Stopped (01/02/19 0934)  . cefTRIAXone (ROCEPHIN)  IV 200 mL/hr at 01/02/19 1000  . levETIRAcetam Stopped (01/02/19 0100)  . phenylephrine (NEO-SYNEPHRINE) Adult infusion 55 mcg/min (01/02/19 1056)   PRN Meds:.acetaminophen **OR** acetaminophen, fentaNYL (SUBLIMAZE) injection, fentaNYL (SUBLIMAZE) injection, labetalol, ondansetron **OR** ondansetron (ZOFRAN) IV, polyethylene glycol, promethazine Medications Prior to Admission:  Prior to Admission medications   Medication Sig Start Date End Date Taking? Authorizing Provider  cholecalciferol (VITAMIN D3) 25 MCG (1000 UT) tablet Take 1,000 Units by mouth daily.   Yes [provider]  divalproex (DEPAKOTE) 500 MG DR  tablet Take 1 tablet (500 mg total) by mouth 2 (two) times daily. Patient taking differently: Take 500 mg by mouth every morning.  03/09/18  Yes Penumalli, Earlean Polka, MD   Allergies  Allergen Reactions  . Shellfish Allergy Anaphylaxis  . Lexapro [Escitalopram Oxalate] Rash  . Sulfa Antibiotics Rash   Review of Systems  Unable to perform ROS: Acuity of condition   Physical Exam Vitals and nursing note reviewed.  Constitutional:      Interventions: She is intubated.  HENT:     Head: Normocephalic and atraumatic.  Cardiovascular:     Rate and Rhythm: Normal rate.  Pulmonary:     Effort: No tachypnea, accessory muscle usage or respiratory distress. She is intubated.     Breath sounds: Normal breath sounds.     Comments: FIO2 40%, weaning on vent Abdominal:     General: Bowel sounds are normal.     Tenderness: There is no abdominal tenderness.  Skin:    General: Skin is warm and dry.  Neurological:     Comments: Does not respond to voice/stimuli during examination. Cough/gag present     Vital Signs: BP 120/83   Pulse 75   Temp (!) 96.8 F (36 C)   Resp 19   Ht 5' 6"  (1.676 m)   Wt 38.8 kg   SpO2 100%   BMI 13.81 kg/m  Pain Scale: CPOT       SpO2: SpO2: 100 % O2 Device:SpO2: 100 % O2 Flow Rate: .   IO: Intake/output summary:   Intake/Output Summary (Last 24 hours) at 01/02/2019 1141 Last data filed at 01/02/2019 1000 Gross per 24 hour  Intake 5194.44 ml  Output 1486 ml  Net 3708.44 ml    LBM: Last BM Date: (uta) Baseline Weight: Weight: 41.8 kg Most recent weight: Weight: 38.8 kg     Palliative Assessment/Data: PPS 30%     Time In: 1055 Time Out: 1155 Time Total: 60 Greater than 50%  of this time was spent counseling and coordinating care related to the above assessment and plan.  Signed by:  Ihor Dow, DNP, FNP-C Palliative Medicine Team  Phone: [phone] Fax: [phone]   Please contact Palliative Medicine Team phone at [phone] for  questions and concerns.  For individual provider: See Shea Evans

## 2019-01-02 NOTE — Consult Note (Signed)
WOC Nurse Consult Note: Reason for Consult: pressure injury; bedbound at home; cared for per patient's family.  Wound type:  1. Sacral pressure injury: Stage 3 pressure injury 2. Left trochanter pressure injury: Deep tissue pressure injury  Pressure Injury POA: Yes Measurement: Left hip: 6cm x 4cm x 0cm  Sacrum: 4cm x 6.5cm x 0.2cm  Wound bed: Left hip: dark purple; non blanchable skin Sacrum: full thickness; centrally ruddy; along right lateral aspect some yellow fibrinous material   Drainage (amount, consistency, odor) minimal, no odor from the sacrum; none from the hip wound Periwound: intact  Dressing procedure/placement/frequency: Add saline moist 2x2 under foam to the sacral wound, change gauze daily. Foam ok to change every 3days Silicone foam to the left trochanter; lift and inspect for wound evolution each shift; consult WOC to re-evaluate if this changes.   Maximize nutrition; RD consult Add Prevalon boots; immobile with low Braden Low air loss mattress in place while in the ICU; will need upon transfer to the floor.   Discussed POC with patient and bedside nurse.  Re consult if needed, will not follow at this time. Thanks  Melenda Bielak [employer], RN,CWOCN, CNS, [place] [phone])

## 2019-01-02 NOTE — Progress Notes (Signed)
Patient transported to CT and back to 4N16. No complications noted. Vitals stable. RN at bedside.

## 2019-01-02 NOTE — Progress Notes (Signed)
Neurosurgery Service Progress Note  Subjective: No acute events overnight   Objective: Vitals:   01/02/19 0500 01/02/19 0600 01/02/19 0700 01/02/19 0732  BP: (!) 78/60 92/65 106/70   Pulse: 98 79 65   Resp: 13 12 12 18   Temp: 97.7 F (36.5 C) (!) 97 F (36.1 C) (!) 96.6 F (35.9 C)   SpO2: 100% 100% 100% 100%  Weight: 38.8 kg     Height:       Temp (24hrs), Avg:98.5 F (36.9 C), Min:96.6 F (35.9 C), Max:99.1 F (37.3 C)  CBC Latest Ref Rng & Units 01/02/2019 01/02/2019 01/01/2019  WBC 4.0 - 10.5 K/uL 16.4(H) QUESTIONABLE RESULTS, RECOMMEND RECOLLECT TO VERIFY -  Hemoglobin 12.0 - 15.0 g/dL 8.1(L) QUESTIONABLE RESULTS, RECOMMEND RECOLLECT TO VERIFY 7.8(L)  Hematocrit 36.0 - 46.0 % 24.6(L) QUESTIONABLE RESULTS, RECOMMEND RECOLLECT TO VERIFY 23.0(L)  Platelets 150 - 400 K/uL 197 QUESTIONABLE RESULTS, RECOMMEND RECOLLECT TO VERIFY -   BMP Latest Ref Rng & Units 01/02/2019 01/02/2019 01/01/2019  Glucose 70 - 99 mg/dL 161(W) QUESTIONABLE RESULTS, RECOMMEND RECOLLECT TO VERIFY -  BUN 8 - 23 mg/dL 11 QUESTIONABLE RESULTS, RECOMMEND RECOLLECT TO VERIFY -  Creatinine 0.44 - 1.00 mg/dL <9.60(A) QUESTIONABLE RESULTS, RECOMMEND RECOLLECT TO VERIFY -  BUN/Creat Ratio 12 - 28 - - -  Sodium 135 - 145 mmol/L 140 QUESTIONABLE RESULTS, RECOMMEND RECOLLECT TO VERIFY 146(H)  Potassium 3.5 - 5.1 mmol/L 3.8 QUESTIONABLE RESULTS, RECOMMEND RECOLLECT TO VERIFY 3.1(L)  Chloride 98 - 111 mmol/L 115(H) QUESTIONABLE RESULTS, RECOMMEND RECOLLECT TO VERIFY -  CO2 22 - 32 mmol/L 18(L) QUESTIONABLE RESULTS, RECOMMEND RECOLLECT TO VERIFY -  Calcium 8.9 - 10.3 mg/dL 7.5(L) QUESTIONABLE RESULTS, RECOMMEND RECOLLECT TO VERIFY -    Intake/Output Summary (Last 24 hours) at 01/02/2019 0827 Last data filed at 01/02/2019 0700 Gross per 24 hour  Intake 5140.45 ml  Output 1146 ml  Net 3994.45 ml    Current Facility-Administered Medications:  .  0.9 %  sodium chloride infusion, , Intravenous, Continuous,  Cassadie Pankonin, Clovis Pu, MD, Last Rate: 75 mL/hr at 01/02/19 0700, Rate Verify at 01/02/19 0700 .  acetaminophen (TYLENOL) tablet 650 mg, 650 mg, Oral, Q4H PRN **OR** acetaminophen (TYLENOL) suppository 650 mg, 650 mg, Rectal, Q4H PRN, Tremon Sainvil A, MD .  cefTRIAXone (ROCEPHIN) 2 g in sodium chloride 0.9 % 100 mL IVPB, 2 g, Intravenous, Q24H, Lorin Glass, MD, Stopped at 01/01/19 1209 .  chlorhexidine gluconate (MEDLINE KIT) (PERIDEX) 0.12 % solution 15 mL, 15 mL, Mouth Rinse, BID, Shondell Fabel, Clovis Pu, MD, 15 mL at 01/02/19 0751 .  Chlorhexidine Gluconate Cloth 2 % PADS 6 each, 6 each, Topical, Daily, Jadene Pierini, MD, 6 each at 01/15/2019 0845 .  feeding supplement (PRO-STAT SUGAR FREE 64) liquid 30 mL, 30 mL, Per Tube, BID, Lorin Glass, MD, 30 mL at 01/01/19 2100 .  feeding supplement (VITAL HIGH PROTEIN) liquid 1,000 mL, 1,000 mL, Per Tube, Q24H, Lorin Glass, MD, 1,000 mL at 01/01/19 1300 .  fentaNYL (SUBLIMAZE) injection 50 mcg, 50 mcg, Intravenous, Q15 min PRN, Stretch, Marveen Reeks, MD, 50 mcg at 01/01/19 0244 .  fentaNYL (SUBLIMAZE) injection 50-200 mcg, 50-200 mcg, Intravenous, Q30 min PRN, Stretch, Marveen Reeks, MD, 100 mcg at 01/01/19 0440 .  insulin aspart (novoLOG) injection 0-24 Units, 0-24 Units, Subcutaneous, Q4H, Lorin Glass, MD .  labetalol (NORMODYNE) injection 10-40 mg, 10-40 mg, Intravenous, Q10 min PRN, Jadene Pierini, MD .  levETIRAcetam (KEPPRA) IVPB 500 mg/100 mL premix, 500 mg,  Intravenous, Q12H, Lorin Glass, MD, Stopped at 01/02/19 0209 .  MEDLINE mouth rinse, 15 mL, Mouth Rinse, 10 times per day, Jadene Pierini, MD, 15 mL at 01/02/19 0505 .  ondansetron (ZOFRAN) tablet 4 mg, 4 mg, Oral, Q4H PRN **OR** ondansetron (ZOFRAN) injection 4 mg, 4 mg, Intravenous, Q4H PRN, Augustin Bun A, MD .  pantoprazole (PROTONIX) injection 40 mg, 40 mg, Intravenous, QHS, Lorin Glass, MD, 40 mg at 01/01/19 2100 .  phenylephrine (NEOSYNEPHRINE) 10-0.9  MG/250ML-% infusion, 0-400 mcg/min, Intravenous, Titrated, Saraia Platner, Clovis Pu, MD, Last Rate: 90 mL/hr at 01/02/19 0747, 60 mcg/min at 01/02/19 0747 .  polyethylene glycol (MIRALAX / GLYCOLAX) packet 17 g, 17 g, Oral, Daily PRN, Jadene Pierini, MD .  promethazine (PHENERGAN) tablet 12.5-25 mg, 12.5-25 mg, Oral, Q4H PRN, Jadene Pierini, MD .  propofol (DIPRIVAN) 1000 MG/100ML infusion, 5-80 mcg/kg/min, Intravenous, Titrated, Carin Shipp, Clovis Pu, MD, Stopped at 12/25/2018 0200   Physical Exam: Intubated, eyes opening to stimulating on left, pupils 3-->2, +R 4th nerve palsy, neutral on left, not FC, w/d x4 with some spontaneous movement.  Assessment & Plan: 63 y.o. 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 12/12 post-op MRI w/ expected residual tumor in brainstem, good debulking, scattered areas of diffusion changes including posterior pons likely 2/2 hemorrhage event, unclear pattern.  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

## 2019-01-03 ENCOUNTER — Inpatient Hospital Stay (HOSPITAL_COMMUNITY): Payer: BLUE CROSS/BLUE SHIELD

## 2019-01-03 DIAGNOSIS — J9601 Acute respiratory failure with hypoxia: Secondary | ICD-10-CM

## 2019-01-03 DIAGNOSIS — E43 Unspecified severe protein-calorie malnutrition: Secondary | ICD-10-CM | POA: Insufficient documentation

## 2019-01-03 LAB — CBC
HCT: 21.7 % — ABNORMAL LOW (ref 36.0–46.0)
Hemoglobin: 7.2 g/dL — ABNORMAL LOW (ref 12.0–15.0)
MCH: 32 pg (ref 26.0–34.0)
MCHC: 33.2 g/dL (ref 30.0–36.0)
MCV: 96.4 fL (ref 80.0–100.0)
Platelets: 203 10*3/uL (ref 150–400)
RBC: 2.25 MIL/uL — ABNORMAL LOW (ref 3.87–5.11)
RDW: 14.1 % (ref 11.5–15.5)
WBC: 10.3 10*3/uL (ref 4.0–10.5)
nRBC: 0 % (ref 0.0–0.2)

## 2019-01-03 LAB — BASIC METABOLIC PANEL
Anion gap: 3 — ABNORMAL LOW (ref 5–15)
BUN: 8 mg/dL (ref 8–23)
CO2: 23 mmol/L (ref 22–32)
Calcium: 7.2 mg/dL — ABNORMAL LOW (ref 8.9–10.3)
Chloride: 115 mmol/L — ABNORMAL HIGH (ref 98–111)
Creatinine, Ser: 0.35 mg/dL — ABNORMAL LOW (ref 0.44–1.00)
GFR calc Af Amer: >60 mL/min (ref >60)
GFR calc non Af Amer: >60 mL/min (ref >60)
Glucose, Bld: 129 mg/dL — ABNORMAL HIGH (ref 70–99)
Potassium: 3.5 mmol/L (ref 3.5–5.1)
Sodium: 141 mmol/L (ref 135–145)

## 2019-01-03 LAB — GLUCOSE, CAPILLARY
Glucose-Capillary: 118 mg/dL — ABNORMAL HIGH (ref 70–99)
Glucose-Capillary: 114 mg/dL — ABNORMAL HIGH (ref 70–99)
Glucose-Capillary: 103 mg/dL — ABNORMAL HIGH (ref 70–99)
Glucose-Capillary: 109 mg/dL — ABNORMAL HIGH (ref 70–99)
Glucose-Capillary: 117 mg/dL — ABNORMAL HIGH (ref 70–99)
Glucose-Capillary: 107 mg/dL — ABNORMAL HIGH (ref 70–99)

## 2019-01-03 LAB — MAGNESIUM
Magnesium: 1.8 mg/dL (ref 1.7–2.4)
Magnesium: 1.8 mg/dL (ref 1.7–2.4)

## 2019-01-03 LAB — PHOSPHORUS
Phosphorus: 1.4 mg/dL — ABNORMAL LOW (ref 2.5–4.6)
Phosphorus: 3.9 mg/dL (ref 2.5–4.6)

## 2019-01-03 MED ORDER — SODIUM PHOSPHATES 45 MMOLE/15ML IV SOLN
30.0000 mmol | Freq: Once | INTRAVENOUS | Status: AC
  Administered 2019-01-03: 30 mmol via INTRAVENOUS
  Filled 2019-01-03: qty 10

## 2019-01-03 NOTE — Progress Notes (Signed)
Daily Progress Note   Patient Name: Jasmine [name]       Date: 01/03/2019 DOB: 1955/09/11  Age: 63 y.o. MRN#: [id number] Attending Physician: Judith Part, MD Primary Care Physician: Chevis Pretty, FNP Admit Date: 01/18/2019  Reason for Consultation/Follow-up: Establishing goals of care  Subjective: Intubated, off sedation. Poorly responsive to stimuli. No s/s of pain or distress.   GOC: F/u GOC with husband, Jasmine [name] at bedside.   Discussed events leading up to admission and course of hospitalization including diagnoses, interventions, plan of care. Frankly and compassionately discussed poor prognosis. Discussed continued aggressive medical management leading to a pathway of trach/peg and vent SNF (sometimes out of state and away from family) versus comfort focused care and compassionate extubation, understanding poor prognosis. This allowing comfort, peace, dignity at EOL.   Jasmine [name] request I speak with the patient's daughter, Jasmine [name] who lives in [place]. Updated daughter on diagnoses, interventions, plan of care, and poor prognosis. Also explained continued aggressive medical interventions (trach/peg/SNF) vs comfort focused care. Answered questions to the best of my ability.   Jasmine [name] is "processing" this information and conversation. He is hoping to speak with Dr. Zada Finders this evening or tomorrow morning. No decisions are made today but he is appreciative of ongoing support from care team. He speaks of their strong Christian faith. Emotional/spiritual support provided. PMT contact information given.   Length of Stay: 4  Current Medications: Scheduled Meds:  . chlorhexidine gluconate (MEDLINE KIT)  15 mL Mouth Rinse BID  . Chlorhexidine Gluconate Cloth  6 each Topical Daily   . heparin injection (subcutaneous)  5,000 Units Subcutaneous Q8H  . insulin aspart  0-24 Units Subcutaneous Q4H  . mouth rinse  15 mL Mouth Rinse 10 times per day  . multivitamin with minerals  1 tablet Per Tube Daily  . pantoprazole (PROTONIX) IV  40 mg Intravenous QHS    Continuous Infusions: . sodium chloride Stopped (01/03/19 1150)  . cefTRIAXone (ROCEPHIN)  IV Stopped (01/03/19 1022)  . feeding supplement (OSMOLITE 1.2 CAL) 1,000 mL (01/02/19 2101)  . levETIRAcetam Stopped (01/03/19 1438)  . phenylephrine (NEO-SYNEPHRINE) Adult infusion 15 mcg/min (01/03/19 1500)  . sodium phosphate  Dextrose 5% IVPB 43 mL/hr at 01/03/19 1500    PRN Meds: acetaminophen **OR** acetaminophen, fentaNYL (SUBLIMAZE) injection, fentaNYL (SUBLIMAZE) injection, labetalol, ondansetron **OR** ondansetron (ZOFRAN)  IV, polyethylene glycol, promethazine  Physical Exam Vitals and nursing note reviewed.  Constitutional:      Appearance: She is ill-appearing.     Interventions: She is intubated.  Cardiovascular:     Rate and Rhythm: Normal rate.  Pulmonary:     Effort: No tachypnea, accessory muscle usage or respiratory distress. She is intubated.     Breath sounds: Normal breath sounds.  Abdominal:     Tenderness: There is no abdominal tenderness.  Skin:    General: Skin is warm and dry.  Neurological:     Comments: Poorly response, intermittently withdraws to pain, does not open eyes to voice/sternal rub.            Vital Signs: BP 103/77 (BP Location: Left Arm)   Pulse 91   Temp 98.4 F (36.9 C)   Resp 18   Ht 5' 6"  (1.676 m)   Wt 38.4 kg   SpO2 100%   BMI 13.66 kg/m  SpO2: SpO2: 100 % O2 Device: O2 Device: Ventilator O2 Flow Rate:    Intake/output summary:   Intake/Output Summary (Last 24 hours) at 01/03/2019 1601 Last data filed at 01/03/2019 1500 Gross per 24 hour  Intake 3169.96 ml  Output 2397 ml  Net 772.96 ml   LBM: Last BM Date: (uta) Baseline Weight: Weight: 41.8 kg  Most recent weight: Weight: 38.4 kg       Palliative Assessment/Data: PPS 30%      Patient Active Problem List   Diagnosis Date Noted  . Protein-calorie malnutrition, severe 01/03/2019  . Palliative care by specialist   . Goals of care, counseling/discussion   . Acute respiratory failure with hypoxia ([place])   . Pressure injury of skin 01/02/2019  . ICH (intracerebral hemorrhage) ([place]) 01/12/2019  . Dementia ([place]) 05/03/2017  . Dermatitis 08/05/2015  . Chronic back pain 12/06/2013    Palliative Care Assessment & Plan   Patient Profile: 63 y.o. female  with past medical history of depression, anxiety, progressive dementia, and epilepsy admitted on 01/17/2019 with acute obtunded state at home. CT with large right hemorrhagic hemispheric mass invading thalamus with associated hemorrhage and hydrocephalus. 12/12 left EVD placed and s/p craniotomy for right tumor debulking and hematoma evacuation. Patient remains on ventilator post-op and PCCM following. Abnormal CT chest consistent with left lower lobe pneumonia, receiving ceftriaxone. EEG without evidence of active seizures, consistent with encephalopathy. Prognosis poor with current exam and MRI findings. Palliative medicine consultation for goals of care.   Assessment: Hemorrhagic intracranial mass s/p debulking Postoperative respiratory failure requiring mechanical ventilation Left lower lobe pneumonia Progressive dementia Severe protein calorie malnutrition  Recommendations/Plan:  Continue FULL code/FULL scope treatment  Ongoing GOC discussions with husband by attending and PMT. Prepared husband that he is faced with big decisions regarding ongoing aggressive medical management (trach/PEG/SNF) vs. Comfort focused care. Discussed poor prognosis. Husband processing this information.   Code Status: FULL   Code Status Orders  (From admission, onward)         Start     Ordered   01/11/2019 0345  Full code  Continuous      01/10/2019 0344        Code Status History    This patient has a current code status but no historical code status.   Advance Care Planning Activity       Prognosis:   Poor prognosis  Discharge Planning:  To Be Determined  Care plan was discussed with RN, husband Jasmine [name]) at bedside  Thank you  for allowing the Palliative Medicine Team to assist in the care of this patient.   Time In: 1510 Time Out: 1600 Total Time 50 Prolonged Time Billed no      Greater than 50%  of this time was spent counseling and coordinating care related to the above assessment and plan.  Ihor Dow, DNP, FNP-C Palliative Medicine Team  Phone: [phone] Fax: [phone]  Please contact Palliative Medicine Team phone at [phone] for questions and concerns.

## 2019-01-03 NOTE — Progress Notes (Addendum)
NAME:  Jasmine [name], MRN:  [id number], DOB:  03/18/1955, LOS: 4 ADMISSION DATE:  12/26/2018, CONSULTATION DATE:  01/01/19 REFERRING MD:  Zada Finders, CHIEF COMPLAINT:  Comatose   Brief History   63 year old woman with a history of possible dementia presenting with progressive neurological decline found to have large hemorrhagic right sided intracranial mass status post craniotomy and persistent respiratory failure postop.  History of present illness   63 year old woman with a history of possible dementia presenting with progressive neurological decline found to have large hemorrhagic right sided intracranial mass status post craniotomy and persistent respiratory failure postop.  After discussion with husband regarding poor prognosis, patient was taken to the operating room for debulking, hematoma removal, and EVD placement by Dr. Zada Finders on 01/19/2019.  She remains on the ventilator postop and PCCM consulted to help manage.  Past Medical History  Dementia versus enlarging intracranial mass  Significant Hospital Events   01/10/2019 craniotomy and right intracranial tumor resection  Consults:  PCCM Palliative Care  Procedures:  See significant Hospital events  Significant Diagnostic Tests:     Micro Data:  COVID Neg Blood cultures x 2 >>  Antimicrobials:  Ceftriaxone 12/13 >>   Interim history/subjective:  Remains unresponsive No sedation given for the last 48 hours Phenylephrine down to 25  Objective   Blood pressure 129/86, pulse (!) 101, temperature 98.4 F (36.9 C), temperature source Bladder, resp. rate 18, height 5\' 6"  (1.676 m), weight 38.4 kg, SpO2 100 %.    Vent Mode: PSV;CPAP FiO2 (%):  [40 %] 40 % Set Rate:  [10 bmp] 10 bmp Vt Set:  [470 mL] 470 mL PEEP:  [5 cmH20] 5 cmH20 Pressure Support:  [10 cmH20-15 cmH20] 10 cmH20 Plateau Pressure:  [19 cmH20] 19 cmH20   Intake/Output Summary (Last 24 hours) at 01/03/2019 [id number] Last data filed at 01/03/2019  0900 Gross per 24 hour  Intake 3712.23 ml  Output 2226 ml  Net 1486.23 ml   Filed Weights   12/28/2018 0045 01/02/19 0500 01/03/19 0453  Weight: 39.6 kg 38.8 kg 38.4 kg    Examination: General: Acute and chronically ill woman, ventilated HENT: Left open, no icterus, ET tube in place, ventricular drain Lungs: Coarse bilaterally, tolerating PS Cardiovascular: Regular, no murmur Abdomen: Nondistended, positive bowel sounds Extremities: No edema, bilateral foot drop Neuro: Left eye spontaneously open, she does not respond to pain or voice.  No purposeful movement noted Skin: Stage III sacral decubitus ulcer  Resolved Hospital Problem list     Assessment & Plan:  # Postoperative respiratory failure related to inability to protect airway and likely aspiration -Acute encephalopathy superimposed on her chronic dementia influencing ability to extubate.  No plans for extubation given her inadequate airway protection, encephalopathy -Okay to continue PSV, follow mental status -Agree with efforts to clarify goals of care, CODE STATUS.  If mental status does not change then the results of continued aggressive care would be tracheostomy, PEG, care in a facility, etc.  # Hemorrhagic intracranial mass with pathology pending, status post debulking, residual tumor in the brainstem -Minimize all sedation -Ventricular drain management and any repeat imaging as per neurosurgery -Continue Keppra, on Depakote at home -Prognosis here is poor, family in discussions with Dr. Zada Finders.  Appreciate palliative care input -Surgical pathology still pending  # Abnormal CT chest consistent with left lower lobe pneumonia.  -No clear evidence of mass lesion on my review of CT chest although remains possible.  Depending on prognosis would need to reimage chest to evaluate  for clearance of infiltrate/lesion with antibiotic therapy -Continue ceftriaxone as ordered, plan for 5 days total  # Shock, suspect sedation  + sepsis due to LLL PNA -Continue to wean phenylephrine  # Progressive dementia that was probably more related to intracranial mass -Question whether some of her evolving mental status changes, encephalopathy were due to the mass itself -Following for any mental status improvement off sedation  # Severe protein calorie malnutrition present on admission -Tube feeding as ordered  # Stage 3 pressure ulcer- sacral, present on admission -Appreciate W OC evaluation  #Hypophosphatemia -replaced 12/15   Best practice:  Diet: TF Pain/Anxiety/Delirium protocol (if indicated): fentanyl prn VAP protocol (if indicated): In place DVT prophylaxis: start SCDs GI prophylaxis: PPI Glucose control: SSI Mobility: BR Code Status: Full Family Communication: discussions undertaken by Dr Zada Finders, Palliative Care involved Disposition: ICU  Labs   CBC: Recent Labs  Lab 12/23/2018 1904 01/16/2019 1046 01/01/19 1101 01/02/19 0322 01/02/19 0356 01/03/19 0447  WBC 15.0*  --   --  QUESTIONABLE RESULTS, RECOMMEND RECOLLECT TO VERIFY 16.4* 10.3  NEUTROABS 11.8*  --   --   --   --   --   HGB 13.4 8.8* 7.8* QUESTIONABLE RESULTS, RECOMMEND RECOLLECT TO VERIFY 8.1* 7.2*  HCT 41.2 26.0* 23.0* QUESTIONABLE RESULTS, RECOMMEND RECOLLECT TO VERIFY 24.6* 21.7*  MCV 97.9  --   --  QUESTIONABLE RESULTS, RECOMMEND RECOLLECT TO VERIFY 98.0 96.4  PLT 354  --   --  QUESTIONABLE RESULTS, RECOMMEND RECOLLECT TO VERIFY [id number]    Basic Metabolic Panel: Recent Labs  Lab 01/17/2019 1904 12/20/2018 1905 01/19/2019 1046 01/01/19 1101 01/01/19 1258 01/02/19 0322 01/02/19 0356 01/02/19 0410 01/02/19 1841 01/03/19 0447  NA 139 139 146* 146*  --  QUESTIONABLE RESULTS, RECOMMEND RECOLLECT TO VERIFY 140  --   --  141  K 4.4 4.4 3.6 3.1*  --  QUESTIONABLE RESULTS, RECOMMEND RECOLLECT TO VERIFY 3.8  --   --  3.5  CL 103 103 111  --   --  QUESTIONABLE RESULTS, RECOMMEND RECOLLECT TO VERIFY 115*  --   --  115*  CO2 26  --   --    --   --  QUESTIONABLE RESULTS, RECOMMEND RECOLLECT TO VERIFY 18*  --   --  23  GLUCOSE 136* 129* 103*  --   --  QUESTIONABLE RESULTS, RECOMMEND RECOLLECT TO VERIFY 119*  --   --  129*  BUN 15 14 10   --   --  QUESTIONABLE RESULTS, RECOMMEND RECOLLECT TO VERIFY 11  --   --  8  CREATININE 0.45 0.50 0.40*  --   --  QUESTIONABLE RESULTS, RECOMMEND RECOLLECT TO VERIFY <0.30*  --   --  0.35*  CALCIUM 9.1  --   --   --   --  QUESTIONABLE RESULTS, RECOMMEND RECOLLECT TO VERIFY 7.5*  --   --  7.2*  MG  --   --   --   --  1.7 QUESTIONABLE RESULTS, RECOMMEND RECOLLECT TO VERIFY  --  2.0 1.8 1.8  PHOS  --   --   --   --  1.2* QUESTIONABLE RESULTS, RECOMMEND RECOLLECT TO VERIFY  --  1.9* 2.0* 1.4*   GFR: Estimated Creatinine Clearance: 43.6 mL/min (A) (by C-G formula based on SCr of 0.35 mg/dL (L)). Recent Labs  Lab 01/06/2019 1904 12/31/2018 2005 01/02/19 0322 01/02/19 0356 01/03/19 0447  WBC 15.0*  --  QUESTIONABLE RESULTS, RECOMMEND RECOLLECT TO VERIFY 16.4* 10.3  LATICACIDVEN  --  1.2  --   --   --     Liver Function Tests: Recent Labs  Lab 12/21/2018 1904  AST 35  ALT 27  ALKPHOS 81  BILITOT 0.5  PROT 7.1  ALBUMIN 3.0*   No results for input(s): LIPASE, AMYLASE in the last 168 hours. No results for input(s): AMMONIA in the last 168 hours.  ABG    Component Value Date/Time   PHART 7.430 01/01/2019 1101   PCO2ART 29.2 (L) 01/01/2019 1101   PO2ART 171.0 (H) 01/01/2019 1101   HCO3 19.3 (L) 01/01/2019 1101   TCO2 20 (L) 01/01/2019 1101   ACIDBASEDEF 4.0 (H) 01/01/2019 1101   O2SAT 100.0 01/01/2019 1101     Coagulation Profile: Recent Labs  Lab 01/09/2019 0331  INR 1.2    Cardiac Enzymes: No results for input(s): CKTOTAL, CKMB, CKMBINDEX, TROPONINI in the last 168 hours.  HbA1C: No results found for: HGBA1C  CBG: Recent Labs  Lab 01/02/19 1514 01/02/19 1941 01/02/19 2317 01/03/19 0320 01/03/19 0750  GLUCAP 96 81 114* 118* 114*    Critical care time: N/a      Baltazar Apo, MD, PhD 01/03/2019, 9:39 AM [facility] [phone] or if no answer [phone]

## 2019-01-03 NOTE — Progress Notes (Signed)
Neurosurgery Service Progress Note  Subjective: No acute events overnight   Objective: Vitals:   01/03/19 0453 01/03/19 0500 01/03/19 0600 01/03/19 0700  BP:  112/82 95/70 90/67   Pulse:  (!) 103 92 80  Resp:  20 16 15   Temp:  98.8 F (37.1 C) 98.6 F (37 C) 98.2 F (36.8 C)  TempSrc:      SpO2:  100% 100% 100%  Weight: 38.4 kg     Height:       Temp (24hrs), Avg:98.1 F (36.7 C), Min:96.8 F (36 C), Max:99.3 F (37.4 C)  CBC Latest Ref Rng & Units 01/03/2019 01/02/2019 01/02/2019  WBC 4.0 - 10.5 K/uL 10.3 16.4(H) QUESTIONABLE RESULTS, RECOMMEND RECOLLECT TO VERIFY  Hemoglobin 12.0 - 15.0 g/dL 7.2(L) 8.1(L) QUESTIONABLE RESULTS, RECOMMEND RECOLLECT TO VERIFY  Hematocrit 36.0 - 46.0 % 21.7(L) 24.6(L) QUESTIONABLE RESULTS, RECOMMEND RECOLLECT TO VERIFY  Platelets 150 - 400 K/uL 203 197 QUESTIONABLE RESULTS, RECOMMEND RECOLLECT TO VERIFY   BMP Latest Ref Rng & Units 01/03/2019 01/02/2019 01/02/2019  Glucose 70 - 99 mg/dL 129(H) 119(H) QUESTIONABLE RESULTS, RECOMMEND RECOLLECT TO VERIFY  BUN 8 - 23 mg/dL 8 11 QUESTIONABLE RESULTS, RECOMMEND RECOLLECT TO VERIFY  Creatinine 0.44 - 1.00 mg/dL 0.35(L) <0.30(L) QUESTIONABLE RESULTS, RECOMMEND RECOLLECT TO VERIFY  BUN/Creat Ratio 12 - 28 - - -  Sodium 135 - 145 mmol/L 141 140 QUESTIONABLE RESULTS, RECOMMEND RECOLLECT TO VERIFY  Potassium 3.5 - 5.1 mmol/L 3.5 3.8 QUESTIONABLE RESULTS, RECOMMEND RECOLLECT TO VERIFY  Chloride 98 - 111 mmol/L 115(H) 115(H) QUESTIONABLE RESULTS, RECOMMEND RECOLLECT TO VERIFY  CO2 22 - 32 mmol/L PENDING 18(L) QUESTIONABLE RESULTS, RECOMMEND RECOLLECT TO VERIFY  Calcium 8.9 - 10.3 mg/dL 7.2(L) 7.5(L) QUESTIONABLE RESULTS, RECOMMEND RECOLLECT TO VERIFY    Intake/Output Summary (Last 24 hours) at 01/03/2019 0724 Last data filed at 01/03/2019 0700 Gross per 24 hour  Intake 3895.74 ml  Output 2558 ml  Net 1337.74 ml    Current Facility-Administered Medications:  .  0.9 %  sodium chloride infusion, ,  Intravenous, Continuous, Areyanna Figeroa, Joyice Faster, MD, Last Rate: 75 mL/hr at 01/03/19 0600, Rate Verify at 01/03/19 0600 .  acetaminophen (TYLENOL) tablet 650 mg, 650 mg, Oral, Q4H PRN **OR** acetaminophen (TYLENOL) suppository 650 mg, 650 mg, Rectal, Q4H PRN, Kyros Salzwedel A, MD .  cefTRIAXone (ROCEPHIN) 2 g in sodium chloride 0.9 % 100 mL IVPB, 2 g, Intravenous, Q24H, Candee Furbish, MD, Stopped at 01/02/19 1004 .  chlorhexidine gluconate (MEDLINE KIT) (PERIDEX) 0.12 % solution 15 mL, 15 mL, Mouth Rinse, BID, Taisley Mordan, Joyice Faster, MD, 15 mL at 01/02/19 1913 .  Chlorhexidine Gluconate Cloth 2 % PADS 6 each, 6 each, Topical, Daily, Judith Part, MD, 6 each at 01/03/19 0000 .  feeding supplement (OSMOLITE 1.2 CAL) liquid 1,000 mL, 1,000 mL, Per Tube, Continuous, Collene Gobble, MD, Last Rate: 55 mL/hr at 01/02/19 2101, 1,000 mL at 01/02/19 2101 .  fentaNYL (SUBLIMAZE) injection 50 mcg, 50 mcg, Intravenous, Q15 min PRN, Stretch, Marily Lente, MD, 50 mcg at 01/01/19 0244 .  fentaNYL (SUBLIMAZE) injection 50-200 mcg, 50-200 mcg, Intravenous, Q30 min PRN, Stretch, Marily Lente, MD, 100 mcg at 01/01/19 0440 .  heparin injection 5,000 Units, 5,000 Units, Subcutaneous, Q8H, Judith Part, MD, 5,000 Units at 01/03/19 0501 .  insulin aspart (novoLOG) injection 0-24 Units, 0-24 Units, Subcutaneous, Q4H, Candee Furbish, MD .  labetalol (NORMODYNE) injection 10-40 mg, 10-40 mg, Intravenous, Q10 min PRN, Judith Part, MD .  levETIRAcetam (KEPPRA) IVPB 500  mg/100 mL premix, 500 mg, Intravenous, Q12H, Candee Furbish, MD, Stopped at 01/03/19 [phone] .  MEDLINE mouth rinse, 15 mL, Mouth Rinse, 10 times per day, Judith Part, MD, 15 mL at 01/03/19 0506 .  multivitamin with minerals tablet 1 tablet, 1 tablet, Per Tube, Daily, Collene Gobble, MD, 1 tablet at 01/02/19 1841 .  ondansetron (ZOFRAN) tablet 4 mg, 4 mg, Oral, Q4H PRN **OR** ondansetron (ZOFRAN) injection 4 mg, 4 mg, Intravenous, Q4H PRN,  Jastin Fore A, MD .  pantoprazole (PROTONIX) injection 40 mg, 40 mg, Intravenous, QHS, Candee Furbish, MD, 40 mg at 01/02/19 2100 .  phenylephrine (NEOSYNEPHRINE) 10-0.9 MG/250ML-% infusion, 0-400 mcg/min, Intravenous, Titrated, Shaka Zech, Joyice Faster, MD, Stopped at 01/03/19 [phone] .  polyethylene glycol (MIRALAX / GLYCOLAX) packet 17 g, 17 g, Oral, Daily PRN, Annesha Delgreco, Joyice Faster, MD .  promethazine (PHENERGAN) tablet 12.5-25 mg, 12.5-25 mg, Oral, Q4H PRN, Judith Part, MD   Physical Exam: Intubated, left eye opens to painful stimulus, pupils 3-->2, +R 4th nerve palsy, pupil neutral position on left, not FC, withdrawing on left and extending on right Incision / drain site c/d/i  Assessment & Plan: 63 y.o. 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 12/12 post-op MRI w/ expected residual tumor in brainstem, good debulking, scattered areas of diffusion changes including posterior pons likely 2/2 hemorrhage event, unclear pattern. 12/14 EEG w/ some focal spikes, no epileptiform activity, CT CAP with consolidation, no primary tumor, 12/15 rpt CTH w/ improved shift / pneumocephalus, ventriculomegaly  -CCM recs -EEG w/ some spikes, no epileptiform activity -clinically significant hypophosphatemia and hypokalemia s/p repletion -drop EVD to +10 -SCDs/TEDs, SQH -will update pt's husband when able. In the best case scenario, if she were to recover enough brainstem function to wake up, she will be hemiplegic, and bed bound. But I do not think she will even get that much function back. Will be trach/PEG/SNF bound, will d/w husband that compassionate extubation is most likely the best option for the patient in this scenario.  Joyice Faster Kashawna Manzer  01/03/19 7:24 AM

## 2019-01-03 NOTE — Evaluation (Signed)
Physical Therapy Evaluation Patient Details Name: Jasmine [name] MRN: [id number] DOB: 1955-11-22 Today's Date: 01/03/2019   History of Present Illness  THis 63 y.o. female admitted with Large hemorrhagic Rt sided incranial mass.  She underwent craniotomy for evacuation of hemorrhage and debulking of the tumor.  PMH includes: progressive cognitve decline/dementia, depression.   Clinical Impression  Pt presently not reacting purposefully to intervention/stimuli by therapies.   PT will sign off at this time in lieu of OT staying in to meet any positioning needs that may arise.      Follow Up Recommendations No PT follow up    Equipment Recommendations  None recommended by PT    Recommendations for Other Services       Precautions / Restrictions Precautions Precautions: Fall;Other (comment) Precaution Comments: ETT tube, EVD       Mobility  Bed Mobility Overal bed mobility: Needs Assistance Bed Mobility: Rolling Rolling: Total assist         General bed mobility comments: unable to assist with any aspects of mobility   Transfers                 General transfer comment: unable   Ambulation/Gait             General Gait Details: unable  Stairs            Wheelchair Mobility    Modified Rankin (Stroke Patients Only)       Balance Overall balance assessment: Needs assistance(not assessed)                                           Pertinent Vitals/Pain Pain Assessment: Faces Faces Pain Scale: No hurt    Home Living Family/patient expects to be discharged to:: Unsure                 Additional Comments: Palliative care conversations ongoing     Prior Function           Comments: Pt unable to provide info, and no family present      Hand Dominance   Dominant Hand: (uncertain )    Extremity/Trunk Assessment   Upper Extremity Assessment Upper Extremity Assessment: RUE deficits/detail RUE Deficits /  Details: No active movement noted.  Pt demonstrates extensor spasticity.  Tightness of internal rotators noted with external rotation to neutral passively.  PROM elbow distally WFL  RUE Coordination: decreased fine motor;decreased gross motor LUE Deficits / Details: No active movement.  Tightness of internal rotators noted with external rotation limited to neutral.  PROM elbow distally WFL  LUE Coordination: decreased fine motor;decreased gross motor    Lower Extremity Assessment Lower Extremity Assessment: RLE deficits/detail;LLE deficits/detail RLE Deficits / Details: General spasticity 4-5/5 resistance to movement.   RLE Coordination: decreased fine motor;decreased gross motor LLE Deficits / Details: low tone relative to R side, no voluntary movement    Cervical / Trunk Assessment Cervical / Trunk Assessment: Other exceptions Cervical / Trunk Exceptions: head and neck rotated to the Lt and flexed.  No head/neck control    Communication   Communication: Other (comment)(ETT )  Cognition Arousal/Alertness: Lethargic Behavior During Therapy: Flat affect Overall Cognitive Status: Impaired/Different from baseline                                 General  Comments: Pt keeps eyes closed majority of time, will open Lt eye to positional change and pain.   No blink to threat noted; pt with generalized response to painful stimulation - flexor withdrawal bil. LEs, and extensor spasticity Rt UE.   She demonstrates oral motor reflex in response to oral stimulation.  She will blink to auditory startle       General Comments General comments (skin integrity, edema, etc.): vss    Exercises     Assessment/Plan    PT Assessment Patent does not need any further PT services(will defer to OT for positioning needs)  PT Problem List         PT Treatment Interventions      PT Goals (Current goals can be found in the Care Plan section)  Acute Rehab PT Goals Patient Stated Goal: pt  unable to participate PT Goal Formulation: All assessment and education complete, DC therapy Potential to Achieve Goals: Poor    Frequency     Barriers to discharge        Co-evaluation PT/OT/SLP Co-Evaluation/Treatment: Yes Reason for Co-Treatment: Complexity of the patient's impairments (multi-system involvement) PT goals addressed during session: Other (comment);Mobility/safety with mobility(awareness) OT goals addressed during session: Strengthening/ROM       AM-PAC PT "6 Clicks" Mobility  Outcome Measure Help needed turning from your back to your side while in a flat bed without using bedrails?: Total Help needed moving from lying on your back to sitting on the side of a flat bed without using bedrails?: Total Help needed moving to and from a bed to a chair (including a wheelchair)?: Total Help needed standing up from a chair using your arms (e.g., wheelchair or bedside chair)?: Total Help needed to walk in hospital room?: Total Help needed climbing 3-5 steps with a railing? : Total 6 Click Score: 6    End of Session   Activity Tolerance: Other (comment)(No purposeful response to evaluation) Patient left: in bed;with call bell/phone within reach;with bed alarm set;with SCD's reapplied Nurse Communication: Mobility status PT Visit Diagnosis: Other symptoms and signs involving the nervous system (Z61.096)    Time: 0454-0981 PT Time Calculation (min) (ACUTE ONLY): 20 min   Charges:   PT Evaluation $PT Eval High Complexity: 1 High          01/03/2019  Jacinto Halim., PT Acute Rehabilitation Services [phone]  (pager) [phone]  (office)  Eliseo Gum Spenser Cong 01/03/2019, 12:39 PM

## 2019-01-03 NOTE — Evaluation (Addendum)
Occupational Therapy Evaluation Patient Details Name: Jasmine [name] MRN: [id number] DOB: [birth year] Today's Date: 01/03/2019    History of Present Illness THis 63 y.o. female admitted with Large hemorrhagic Rt sided incranial mass.  She underwent craniotomy for evacuation of hemorrhage and debulking of the tumor.  PMH includes: progressive cognitve decline/dementia, depression.    Clinical Impression   Pt admitted with above. She demonstrates the below listed deficits and will benefit from continued OT to maximize safety and independence with BADLs.  Pt seen in conjunction with PT.  She demonstrates generalized responses to painful stimuli (extensor posturing noted Rt UE and flexor withdrawal bil. LEs).   She opens Lt eye partially, but does not blink to threat.  She blinks to auditory stimuli;  She does not move or use bil. UEs or LEs spontaneously.  She is unable to assist or engage in any functional activity or mobility.  Will see pt 1x/wk for a trial of OT if arousal improves, and to assess for splinting/positioning needs.        Follow Up Recommendations  LTACH;SNF    Equipment Recommendations  None recommended by OT    Recommendations for Other Services       Precautions / Restrictions Precautions Precautions: Fall;Other (comment) Precaution Comments: ETT tube, EVD       Mobility Bed Mobility Overal bed mobility: Needs Assistance Bed Mobility: Rolling Rolling: Total assist         General bed mobility comments: unable to assist with any aspects of mobility   Transfers                 General transfer comment: unable     Balance                                           ADL either performed or assessed with clinical judgement   ADL Overall ADL's : Needs assistance/impaired Eating/Feeding: NPO                                     General ADL Comments: Pt unable to assist with any aspect of ADLs - total A.       Vision   Additional Comments: no eye opening Rt eye. will open Lt eye 1/2 way.  Eyes dysconjugate when both eyelids open manually. Does not track, and no blink to threat      Perception     Praxis      Pertinent Vitals/Pain Pain Assessment: Faces Faces Pain Scale: No hurt     Hand Dominance (uncertain )   Extremity/Trunk Assessment Upper Extremity Assessment Upper Extremity Assessment: RUE deficits/detail;LUE deficits/detail RUE Deficits / Details: No active movement noted.  Pt demonstrates extensor spasticity.  Tightness of internal rotators noted with external rotation to neutral passively.  PROM elbow distally WFL  RUE Coordination: decreased fine motor;decreased gross motor LUE Deficits / Details: No active movement.  Tightness of internal rotators noted with external rotation limited to neutral.  PROM elbow distally WFL  LUE Coordination: decreased fine motor;decreased gross motor   Lower Extremity Assessment Lower Extremity Assessment: Defer to PT evaluation   Cervical / Trunk Assessment Cervical / Trunk Assessment: Other exceptions Cervical / Trunk Exceptions: head and neck rotated to the Lt and flexed.  No head/neck control     Communication  Communication Communication: Other (comment)(ETT )   Cognition Arousal/Alertness: Lethargic Behavior During Therapy: Flat affect Overall Cognitive Status: Impaired/Different from baseline                                 General Comments: Pt keeps eyes closed majority of time, will open Lt eye to positional change and pain.   No blink to threat noted; pt with generalized response to painful stimulation - flexor withdrawal bil. LEs, and extensor spasticity Rt UE.   She demonstrates oral motor reflex in response to oral stimulation.  She will blink to auditory startle    General Comments  VSS throughout     Exercises     Shoulder Instructions      Home Living Family/patient expects to be discharged to::  Unsure                                 Additional Comments: Palliative care conversations ongoing       Prior Functioning/Environment          Comments: Pt unable to provide info, and no family present         OT Problem List: Decreased strength;Decreased range of motion;Decreased activity tolerance;Impaired balance (sitting and/or standing);Impaired vision/perception;Decreased coordination;Decreased cognition;Decreased safety awareness;Cardiopulmonary status limiting activity;Impaired UE functional use;Impaired tone      OT Treatment/Interventions: Neuromuscular education;Manual therapy;Splinting;Patient/family education;Cognitive remediation/compensation;Therapeutic activities    OT Goals(Current goals can be found in the care plan section) Acute Rehab OT Goals OT Goal Formulation: Patient unable to participate in goal setting Time For Goal Achievement: 01/17/19 Potential to Achieve Goals: Poor ADL Goals Additional ADL Goal #1: Pt will demonstrate localized responses 25% of time Additional ADL Goal #2: Spouse will be independent with PROM  OT Frequency: Min 1X/week   Barriers to D/C: Decreased caregiver support          Co-evaluation PT/OT/SLP Co-Evaluation/Treatment: Yes Reason for Co-Treatment: Complexity of the patient's impairments (multi-system involvement);Necessary to address cognition/behavior during functional activity;For patient/therapist safety   OT goals addressed during session: Strengthening/ROM      AM-PAC OT "6 Clicks" Daily Activity     Outcome Measure Help from another person eating meals?: Total Help from another person taking care of personal grooming?: Total Help from another person toileting, which includes using toliet, bedpan, or urinal?: Total Help from another person bathing (including washing, rinsing, drying)?: Total Help from another person to put on and taking off regular upper body clothing?: Total Help from another  person to put on and taking off regular lower body clothing?: Total 6 Click Score: 6   End of Session Equipment Utilized During Treatment: Oxygen Nurse Communication: Other (comment)(responses during eval )  Activity Tolerance: Other (comment)(poor arousal/responsiveness ) Patient left: in bed  OT Visit Diagnosis: Muscle weakness (generalized) (M62.81);Cognitive communication deficit (R41.841)                Time: [id number] OT Time Calculation (min): 20 min Charges:  OT General Charges $OT Visit: 1 Visit OT Evaluation $OT Eval High Complexity: 1 High  Nilsa Nutting., OTR/L Acute Rehabilitation Services Pager [phone] Office [place], [place] 01/03/2019, 12:26 PM

## 2019-01-04 ENCOUNTER — Inpatient Hospital Stay (HOSPITAL_COMMUNITY): Payer: BLUE CROSS/BLUE SHIELD

## 2019-01-04 LAB — CULTURE, BLOOD (ROUTINE X 2)
Culture: NO GROWTH
Culture: NO GROWTH
Special Requests: ADEQUATE
Special Requests: ADEQUATE

## 2019-01-04 LAB — BPAM RBC
Blood Product Expiration Date: 202012282359
Blood Product Expiration Date: 202012302359
Unit Type and Rh: 5100
Unit Type and Rh: 5100

## 2019-01-04 LAB — TYPE AND SCREEN
ABO/RH(D): O POS
Antibody Screen: NEGATIVE
Unit division: 0
Unit division: 0

## 2019-01-04 LAB — BASIC METABOLIC PANEL
Anion gap: 11 (ref 5–15)
BUN: 5 mg/dL — ABNORMAL LOW (ref 8–23)
CO2: 22 mmol/L (ref 22–32)
Calcium: 7.8 mg/dL — ABNORMAL LOW (ref 8.9–10.3)
Chloride: 108 mmol/L (ref 98–111)
Creatinine, Ser: <0.3 mg/dL — ABNORMAL LOW (ref 0.44–1.00)
Glucose, Bld: 120 mg/dL — ABNORMAL HIGH (ref 70–99)
Potassium: 4 mmol/L (ref 3.5–5.1)
Sodium: 141 mmol/L (ref 135–145)

## 2019-01-04 LAB — GLUCOSE, CAPILLARY
Glucose-Capillary: 143 mg/dL — ABNORMAL HIGH (ref 70–99)
Glucose-Capillary: 139 mg/dL — ABNORMAL HIGH (ref 70–99)
Glucose-Capillary: 131 mg/dL — ABNORMAL HIGH (ref 70–99)
Glucose-Capillary: 112 mg/dL — ABNORMAL HIGH (ref 70–99)
Glucose-Capillary: 108 mg/dL — ABNORMAL HIGH (ref 70–99)
Glucose-Capillary: 120 mg/dL — ABNORMAL HIGH (ref 70–99)

## 2019-01-04 LAB — CBC
HCT: 25.2 % — ABNORMAL LOW (ref 36.0–46.0)
Hemoglobin: 8.3 g/dL — ABNORMAL LOW (ref 12.0–15.0)
MCH: 32.4 pg (ref 26.0–34.0)
MCHC: 32.9 g/dL (ref 30.0–36.0)
MCV: 98.4 fL (ref 80.0–100.0)
Platelets: 296 10*3/uL (ref 150–400)
RBC: 2.56 MIL/uL — ABNORMAL LOW (ref 3.87–5.11)
RDW: 14.4 % (ref 11.5–15.5)
WBC: 9.4 10*3/uL (ref 4.0–10.5)
nRBC: 0 % (ref 0.0–0.2)

## 2019-01-04 LAB — MAGNESIUM
Magnesium: 1.7 mg/dL (ref 1.7–2.4)
Magnesium: 1.7 mg/dL (ref 1.7–2.4)

## 2019-01-04 LAB — PHOSPHORUS
Phosphorus: 3.3 mg/dL (ref 2.5–4.6)
Phosphorus: 3.1 mg/dL (ref 2.5–4.6)

## 2019-01-04 NOTE — Progress Notes (Signed)
Neurosurgery Service Progress Note  Subjective: No acute events overnight   Objective: Vitals:   01/04/19 0600 01/04/19 0700 01/04/19 0722 01/04/19 0800  BP: 108/71 (!) 89/67  121/86  Pulse: 89 84  99  Resp: 17 13  (!) 22  Temp: 98.1 F (36.7 C) 97.9 F (36.6 C)  97.7 F (36.5 C)  TempSrc:    Bladder  SpO2: 100% 100% 100% 100%  Weight:      Height:       Temp (24hrs), Avg:98.3 F (36.8 C), Min:97.7 F (36.5 C), Max:98.8 F (37.1 C)  CBC Latest Ref Rng & Units 01/04/2019 01/03/2019 01/02/2019  WBC 4.0 - 10.5 K/uL 9.4 10.3 16.4(H)  Hemoglobin 12.0 - 15.0 g/dL 8.3(L) 7.2(L) 8.1(L)  Hematocrit 36.0 - 46.0 % 25.2(L) 21.7(L) 24.6(L)  Platelets 150 - 400 K/uL 296 203 197   BMP Latest Ref Rng & Units 01/04/2019 01/03/2019 01/02/2019  Glucose 70 - 99 mg/dL 120(H) 129(H) 119(H)  BUN 8 - 23 mg/dL 5(L) 8 11  Creatinine 0.44 - 1.00 mg/dL <0.30(L) 0.35(L) <0.30(L)  BUN/Creat Ratio 12 - 28 - - -  Sodium 135 - 145 mmol/L 141 141 140  Potassium 3.5 - 5.1 mmol/L 4.0 3.5 3.8  Chloride 98 - 111 mmol/L 108 115(H) 115(H)  CO2 22 - 32 mmol/L 22 23 18(L)  Calcium 8.9 - 10.3 mg/dL 7.8(L) 7.2(L) 7.5(L)    Intake/Output Summary (Last 24 hours) at 01/04/2019 0940 Last data filed at 01/04/2019 0600 Gross per 24 hour  Intake 3291.21 ml  Output 2641 ml  Net 650.21 ml    Current Facility-Administered Medications:  .  0.9 %  sodium chloride infusion, , Intravenous, Continuous, Raeley Gilmore, Joyice Faster, MD, Last Rate: 75 mL/hr at 01/04/19 0600, Rate Verify at 01/04/19 0600 .  acetaminophen (TYLENOL) tablet 650 mg, 650 mg, Oral, Q4H PRN **OR** acetaminophen (TYLENOL) suppository 650 mg, 650 mg, Rectal, Q4H PRN, Pryce Folts A, MD .  cefTRIAXone (ROCEPHIN) 2 g in sodium chloride 0.9 % 100 mL IVPB, 2 g, Intravenous, Q24H, Candee Furbish, MD, Stopped at 01/03/19 1022 .  chlorhexidine gluconate (MEDLINE KIT) (PERIDEX) 0.12 % solution 15 mL, 15 mL, Mouth Rinse, BID, Keilee Denman, Joyice Faster, MD, 15 mL at  01/04/19 0753 .  Chlorhexidine Gluconate Cloth 2 % PADS 6 each, 6 each, Topical, Daily, Judith Part, MD, 6 each at 01/03/19 [phone] .  feeding supplement (OSMOLITE 1.2 CAL) liquid 1,000 mL, 1,000 mL, Per Tube, Continuous, Collene Gobble, MD, Last Rate: 55 mL/hr at 01/04/19 0832, 1,000 mL at 01/04/19 0832 .  fentaNYL (SUBLIMAZE) injection 50 mcg, 50 mcg, Intravenous, Q15 min PRN, Stretch, Marily Lente, MD, 50 mcg at 01/01/19 0244 .  fentaNYL (SUBLIMAZE) injection 50-200 mcg, 50-200 mcg, Intravenous, Q30 min PRN, Stretch, Marily Lente, MD, 100 mcg at 01/01/19 0440 .  heparin injection 5,000 Units, 5,000 Units, Subcutaneous, Q8H, Judith Part, MD, 5,000 Units at 01/04/19 0506 .  insulin aspart (novoLOG) injection 0-24 Units, 0-24 Units, Subcutaneous, Q4H, Candee Furbish, MD, 2 Units at 01/04/19 [phone] .  labetalol (NORMODYNE) injection 10-40 mg, 10-40 mg, Intravenous, Q10 min PRN, Judith Part, MD, 10 mg at 01/04/19 0130 .  levETIRAcetam (KEPPRA) IVPB 500 mg/100 mL premix, 500 mg, Intravenous, Q12H, Candee Furbish, MD, Stopped at 01/04/19 0115 .  MEDLINE mouth rinse, 15 mL, Mouth Rinse, 10 times per day, Judith Part, MD, 15 mL at 01/04/19 0505 .  multivitamin with minerals tablet 1 tablet, 1 tablet, Per Tube, Daily,  Collene Gobble, MD, 1 tablet at 01/03/19 [phone] .  ondansetron (ZOFRAN) tablet 4 mg, 4 mg, Oral, Q4H PRN **OR** ondansetron (ZOFRAN) injection 4 mg, 4 mg, Intravenous, Q4H PRN, Deetta Siegmann A, MD .  pantoprazole (PROTONIX) injection 40 mg, 40 mg, Intravenous, QHS, Candee Furbish, MD, 40 mg at 01/03/19 2101 .  phenylephrine (NEOSYNEPHRINE) 10-0.9 MG/250ML-% infusion, 0-400 mcg/min, Intravenous, Titrated, Octavia Mottola, Joyice Faster, MD, Stopped at 01/04/19 0101 .  polyethylene glycol (MIRALAX / GLYCOLAX) packet 17 g, 17 g, Oral, Daily PRN, Devona Holmes, Joyice Faster, MD .  promethazine (PHENERGAN) tablet 12.5-25 mg, 12.5-25 mg, Oral, Q4H PRN, Judith Part, MD   Physical  Exam: Intubated, left eye tries to open but not fully opening to painful stimulus, pupils 3-->2, +R 4th nerve palsy, pupil neutral position on left, not FC, withdrawing on left and extending on right Incision / drain site c/d/i  Assessment & Plan: 63 y.o. 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 12/12 post-op MRI w/ expected residual tumor in brainstem, good debulking, scattered areas of diffusion changes including posterior pons likely 2/2 hemorrhage event, unclear pattern. 12/14 EEG w/ some focal spikes, no epileptiform activity, CT CAP with consolidation, no primary tumor, 12/15 rpt CTH w/ improved shift / pneumocephalus, ventriculomegaly  -CCM recs, phenylepherine gtt off this morning -cont EVD at  +10 -SCDs/TEDs, SQH -I was unable to connect with the pt's husband yesterday, please call me when he arrives today and we will find a time to discuss further, as I'd prefer to discuss this in-person. Greatly appreciate ongoing efforts by palliative care / assistance from CCM.  Joyice Faster Advay Volante  01/04/19 9:40 AM

## 2019-01-04 NOTE — Progress Notes (Signed)
NAME:  Jasmine [name], MRN:  [id number], DOB:  10-Nov-1955, LOS: 5 ADMISSION DATE:  01/03/2019, CONSULTATION DATE:  01/01/19 REFERRING MD:  Zada Finders, CHIEF COMPLAINT:  Comatose   Brief History   63 year old woman with a history of possible dementia presenting with progressive neurological decline found to have large hemorrhagic right sided intracranial mass status post craniotomy and persistent respiratory failure postop.  History of present illness   63 year old woman with a history of possible dementia presenting with progressive neurological decline found to have large hemorrhagic right sided intracranial mass status post craniotomy and persistent respiratory failure postop.  After discussion with husband regarding poor prognosis, patient was taken to the operating room for debulking, hematoma removal, and EVD placement by Dr. Zada Finders on 12/22/2018.  She remains on the ventilator postop and PCCM consulted to help manage.  Past Medical History  Dementia versus enlarging intracranial mass  Significant Hospital Events   12/25/2018 craniotomy and right intracranial tumor resection  Consults:  PCCM Palliative Care  Procedures:  See significant Hospital events  Significant Diagnostic Tests:     Micro Data:  COVID Neg Blood cultures x 2 >>  Antimicrobials:  Ceftriaxone 12/13 >>   Interim history/subjective:  Remains unresponsive No sedation given for the last 48 hours Phenylephrine down to 25  Objective   Blood pressure 121/86, pulse 99, temperature 97.7 F (36.5 C), temperature source Bladder, resp. rate (!) 22, height 5\' 6"  (1.676 m), weight 37.9 kg, SpO2 100 %.    Vent Mode: PSV;CPAP FiO2 (%):  [40 %] 40 % Set Rate:  [10 bmp] 10 bmp Vt Set:  [470 mL] 470 mL PEEP:  [5 cmH20] 5 cmH20 Pressure Support:  [10 cmH20] 10 cmH20 Plateau Pressure:  [20 cmH20-21 cmH20] 20 cmH20   Intake/Output Summary (Last 24 hours) at 01/04/2019 1017 Last data filed at 01/04/2019  0900 Gross per 24 hour  Intake 3291.21 ml  Output 2643 ml  Net 648.21 ml   Filed Weights   01/02/19 0500 01/03/19 0453 01/04/19 0500  Weight: 38.8 kg 38.4 kg 37.9 kg    Examination: General: Critically ill appearing adult F, intubated off sedation.  HENT: Incision c/d/i, EVD -10. ETT secure. OGT secure. Anicteric sclera.  Lungs: No accessory muscle use. Bibasilar rhonchi  Cardiovascular: RRR s1s2 no rgm  Abdomen: ndnt. + bowel sounds  Extremities: No edema, bilateral foot drop Neuro: Does not open eyes to painful stimuli but is attempting. Does not follow commands.  Skin: Sacral decubitus ulcer dressing intact.   Resolved Hospital Problem list     Assessment & Plan:   # Postoperative respiratory failure related to inability to protect airway and likely aspiration -Acute encephalopathy superimposed on her chronic dementia influencing ability to extubate.  No plans for extubation given her inadequate airway protection, encephalopathy P -Ok to continue PSV trials but mentation will prohibit extubation -Primary, Palliative working on [place] -Aggressive interventions would include Trach, PEG, care facility   # Hemorrhagic intracranial mass with pathology pending, status post debulking, residual tumor in the brainstem -Poor prognosis  P -off sedation -ventricular drain, further imaging per NSGY  -Continue AEDs  # Abnormal CT chest consistent with left lower lobe pneumonia.  -No clear evidence of mass lesion on my review of CT chest although remains possible.  Depending on prognosis would need to reimage chest to evaluate for clearance of infiltrate/lesion with antibiotic therapy -Continue ceftriaxone as ordered, plan for 5 days total  # Shock, improved -Off neo at this time  -Will leave  neo on MAR this morning, if remains off will dc   # Progressive dementia  -that was probably more related to intracranial mass P -continue off sedation   # Severe protein calorie  malnutrition - present on admission #Hypophosphatemia -EN -trend electrolytes and replace as needed   # Stage 3 pressure ulcer- sacral, present on admission -WOC eval   Best practice:  Diet: TF Pain/Anxiety/Delirium protocol (if indicated): fentanyl prn VAP protocol (if indicated): In place DVT prophylaxis: start SCDs GI prophylaxis: PPI Glucose control: SSI Mobility: BR Code Status: Full Family Communication: discussions undertaken by Dr Zada Finders, Palliative Care involved Disposition: ICU  Labs   CBC: Recent Labs  Lab 12/28/2018 1904 01/01/19 1101 01/02/19 0322 01/02/19 0356 01/03/19 0447 01/04/19 0500  WBC 15.0*  --  QUESTIONABLE RESULTS, RECOMMEND RECOLLECT TO VERIFY 16.4* 10.3 9.4  NEUTROABS 11.8*  --   --   --   --   --   HGB 13.4 7.8* QUESTIONABLE RESULTS, RECOMMEND RECOLLECT TO VERIFY 8.1* 7.2* 8.3*  HCT 41.2 23.0* QUESTIONABLE RESULTS, RECOMMEND RECOLLECT TO VERIFY 24.6* 21.7* 25.2*  MCV 97.9  --  QUESTIONABLE RESULTS, RECOMMEND RECOLLECT TO VERIFY 98.0 96.4 98.4  PLT 354  --  QUESTIONABLE RESULTS, RECOMMEND RECOLLECT TO VERIFY 197 203 0000000    Basic Metabolic Panel: Recent Labs  Lab 12/22/2018 1904 01/15/2019 1046 12/25/2018 1046 01/01/19 1101 01/02/19 0322 01/02/19 0356 01/02/19 0410 01/02/19 1841 01/03/19 0447 01/03/19 1700 01/04/19 0500  NA 139 146*  --  146* QUESTIONABLE RESULTS, RECOMMEND RECOLLECT TO VERIFY 140  --   --  141  --  141  K 4.4 3.6  --  3.1* QUESTIONABLE RESULTS, RECOMMEND RECOLLECT TO VERIFY 3.8  --   --  3.5  --  4.0  CL 103 111  --   --  QUESTIONABLE RESULTS, RECOMMEND RECOLLECT TO VERIFY 115*  --   --  115*  --  108  CO2 26  --   --   --  QUESTIONABLE RESULTS, RECOMMEND RECOLLECT TO VERIFY 18*  --   --  23  --  22  GLUCOSE 136* 103*  --   --  QUESTIONABLE RESULTS, RECOMMEND RECOLLECT TO VERIFY 119*  --   --  129*  --  120*  BUN 15 10  --   --  QUESTIONABLE RESULTS, RECOMMEND RECOLLECT TO VERIFY 11  --   --  8  --  5*  CREATININE 0.45  0.40*  --   --  QUESTIONABLE RESULTS, RECOMMEND RECOLLECT TO VERIFY <0.30*  --   --  0.35*  --  <0.30*  CALCIUM 9.1  --   --   --  QUESTIONABLE RESULTS, RECOMMEND RECOLLECT TO VERIFY 7.5*  --   --  7.2*  --  7.8*  MG  --   --    < >  --  QUESTIONABLE RESULTS, RECOMMEND RECOLLECT TO VERIFY  --  2.0 1.8 1.8 1.8 1.7  PHOS  --   --    < >  --  QUESTIONABLE RESULTS, RECOMMEND RECOLLECT TO VERIFY  --  1.9* 2.0* 1.4* 3.9 3.3   < > = values in this interval not displayed.   GFR: CrCl cannot be calculated (This lab value cannot be used to calculate CrCl because it is not a number: <0.30). Recent Labs  Lab 12/31/2018 2005 01/02/19 0322 01/02/19 0356 01/03/19 0447 01/04/19 0500  WBC  --  QUESTIONABLE RESULTS, RECOMMEND RECOLLECT TO VERIFY 16.4* 10.3 9.4  LATICACIDVEN 1.2  --   --   --   --  Liver Function Tests: Recent Labs  Lab 01/18/2019 1904  AST 35  ALT 27  ALKPHOS 81  BILITOT 0.5  PROT 7.1  ALBUMIN 3.0*   No results for input(s): LIPASE, AMYLASE in the last 168 hours. No results for input(s): AMMONIA in the last 168 hours.  ABG    Component Value Date/Time   PHART 7.430 01/01/2019 1101   PCO2ART 29.2 (L) 01/01/2019 1101   PO2ART 171.0 (H) 01/01/2019 1101   HCO3 19.3 (L) 01/01/2019 1101   TCO2 20 (L) 01/01/2019 1101   ACIDBASEDEF 4.0 (H) 01/01/2019 1101   O2SAT 100.0 01/01/2019 1101     Coagulation Profile: Recent Labs  Lab 12/21/2018 0331  INR 1.2    Cardiac Enzymes: No results for input(s): CKTOTAL, CKMB, CKMBINDEX, TROPONINI in the last 168 hours.  HbA1C: No results found for: HGBA1C  CBG: Recent Labs  Lab 01/03/19 1528 01/03/19 1926 01/03/19 2323 01/04/19 0323 01/04/19 0731  GLUCAP 109* 117* 107* 143* 139*    Critical care time: n/a       Eliseo Gum MSN, AGACNP-BC [place] [id number] If no answer, [id number] 01/04/2019, 10:19 AM

## 2019-01-04 NOTE — Progress Notes (Signed)
Daily Progress Note   Patient Name: Jasmine [name]       Date: 01/04/2019 DOB: 1955/02/12  Age: 63 y.o. MRN#: [id number] Attending Physician: Jasmine Part, MD Primary Care Physician: Jasmine Pretty, FNP Admit Date: 12/29/2018  Reason for Consultation/Follow-up: Establishing goals of care  Subjective: Intubated, off sedation. Poorly responsive to stimuli. No s/s of pain or distress.  GOC:  PMT provider at bedside during Dr. Colleen Can discussion with husband and daughter (Jasmine [name] via telephone) regarding diagnoses, prognosis, plan of care including ongoing aggressive medical management including trach. Plan to continue current plan of care and medical management. Family wishes to allow Jasmine [name] more time for outcomes before making this decision. Family to further discuss and make decision for trach/ongoing aggressive medical management vs. Comfort care by Monday, 12/21.  Stayed at bedside to [place]. Jasmine [name] continues to process these tough conversations. We discussed trying to keep Jasmine [name] at the center of decision making and really consider what she would want for herself. Jasmine [name] shares thoughts that he does not think she would wish to be bedbound for the rest of her life. He is considering quality of life aspects with this decision. He shares that Jasmine [name] was a 'party animal' and loved to dance. She would dance for hours.  Emotional/spiritual support provided. Jasmine [name] has PMT contact information. Shared that I will return to service on Monday 12/21 and will follow up at that time. Encouraged Jasmine [name] to call PMT phone with needs or concerns over the weekend.    Length of Stay: 5  Current Medications: Scheduled Meds:  . chlorhexidine gluconate (MEDLINE KIT)  15 mL Mouth Rinse  BID  . Chlorhexidine Gluconate Cloth  6 each Topical Daily  . heparin injection (subcutaneous)  5,000 Units Subcutaneous Q8H  . insulin aspart  0-24 Units Subcutaneous Q4H  . mouth rinse  15 mL Mouth Rinse 10 times per day  . multivitamin with minerals  1 tablet Per Tube Daily  . pantoprazole (PROTONIX) IV  40 mg Intravenous QHS    Continuous Infusions: . sodium chloride 75 mL/hr at 01/04/19 1600  . cefTRIAXone (ROCEPHIN)  IV Stopped (01/04/19 1206)  . feeding supplement (OSMOLITE 1.2 CAL) 1,000 mL (01/04/19 0832)  . levETIRAcetam Stopped (01/04/19 1347)  . phenylephrine (NEO-SYNEPHRINE) Adult infusion Stopped (01/04/19 1204)    PRN Meds: acetaminophen **OR**  acetaminophen, fentaNYL (SUBLIMAZE) injection, fentaNYL (SUBLIMAZE) injection, labetalol, ondansetron **OR** ondansetron (ZOFRAN) IV, polyethylene glycol, promethazine  Physical Exam Vitals and nursing note reviewed.  Constitutional:      Appearance: She is ill-appearing.     Interventions: She is intubated.  Cardiovascular:     Rate and Rhythm: Normal rate.  Pulmonary:     Effort: No tachypnea, accessory muscle usage or respiratory distress. She is intubated.     Breath sounds: Normal breath sounds.  Abdominal:     Tenderness: There is no abdominal tenderness.  Skin:    General: Skin is warm and dry.  Neurological:     Comments: Poorly response, intermittently withdraws to pain, does not open eyes to voice/sternal rub.            Vital Signs: BP 102/72 (BP Location: Right Arm)   Pulse (!) 103   Temp 99.5 F (37.5 C) (Bladder)   Resp 16   Ht _0  (1.676 m)   Wt 37.9 kg   SpO2 100%   BMI 13.49 kg/m  SpO2: SpO2: 100 % O2 Device: O2 Device: Ventilator O2 Flow Rate:    Intake/output summary:   Intake/Output Summary (Last 24 hours) at 01/04/2019 1645 Last data filed at 01/04/2019 1600 Gross per 24 hour  Intake 3945.25 ml  Output 1762 ml  Net 2183.25 ml   LBM: Last BM Date: (pta) Baseline Weight: Weight:  41.8 kg Most recent weight: Weight: 37.9 kg       Palliative Assessment/Data: PPS 30%      Patient Active Problem List   Diagnosis Date Noted  . Protein-calorie malnutrition, severe 01/03/2019  . Palliative care by specialist   . Goals of care, counseling/discussion   . Acute respiratory failure with hypoxia ([place])   . Pressure injury of skin 01/06/2019  . ICH (intracerebral hemorrhage) ([place]) 12/21/2018  . Dementia ([place]) 05/03/2017  . Dermatitis 08/05/2015  . Chronic back pain 12/06/2013    Palliative Care Assessment & Plan   Patient Profile: 63 y.o. female  with past medical history of depression, anxiety, progressive dementia, and epilepsy admitted on 01/03/2019 with acute obtunded state at home. CT with large right hemorrhagic hemispheric mass invading thalamus with associated hemorrhage and hydrocephalus. 12/12 left EVD placed and s/p craniotomy for right tumor debulking and hematoma evacuation. Patient remains on ventilator post-op and PCCM following. Abnormal CT chest consistent with left lower lobe pneumonia, receiving ceftriaxone. EEG without evidence of active seizures, consistent with encephalopathy. Prognosis poor with current exam and MRI findings. Palliative medicine consultation for goals of care.   Assessment: Hemorrhagic intracranial mass s/p debulking Postoperative respiratory failure requiring mechanical ventilation Left lower lobe pneumonia Progressive dementia Severe protein calorie malnutrition  Recommendations/Plan:  Continue FULL code/FULL scope treatment. Continue current plan of care.   Ongoing [place] discussions with husband by attending and PMT. Prepared husband that he is faced with big decisions regarding ongoing aggressive medical management (trach/PEG/SNF) vs. Comfort focused care. Family to further discuss and make decision by Monday 12/21 regarding trach vs. Comfort.   PMT will continue to follow and support patient/family.   Code  Status: FULL   Code Status Orders  (From admission, onward)         Start     Ordered   01/09/2019 0345  Full code  Continuous     01/07/2019 0344        Code Status History    This patient has a current code status but no historical code status.  Advance Care Planning Activity       Prognosis:   Poor prognosis  Discharge Planning:  To Be Determined  Care plan was discussed with RN, husband Jasmine [name]) at bedside  Thank you for allowing the Palliative Medicine Team to assist in the care of this patient.   Time In: 1620 Time Out: 1640 Total Time 20 Prolonged Time Billed no      Greater than 50%  of this time was spent counseling and coordinating care related to the above assessment and plan.  Ihor Dow, DNP, FNP-C Palliative Medicine Team  Phone: [phone] Fax: [phone]  Please contact Palliative Medicine Team phone at [phone] for questions and concerns.

## 2019-01-04 NOTE — Progress Notes (Signed)
Patient transported to CT and back to [id number] without complications.

## 2019-01-04 NOTE — Discharge Summary (Addendum)
Discharge Summary  Date of Admission: 12/28/2018  Date of Discharge: 01/12/2019  Attending Physician: Emelda Brothers, MD  Hospital Course: Patient was admitted after being acutely obtunded and found down by her husband. CTH showed a large right hemispheric mass with tumoral apoplexy and resulting hydrocephalus. A left frontal EVD was emergently placed at bedside and she was taken to the OR for a right craniotomy for tumor debulking and hematoma evacuation. Given the emergent nature of the procedure and the extension of tumor/hematoma into the brainstem, a complete resection was not attempted. Her preop CXR showed a hilar mass, but CT CAP showed that this was just focal consolidation without clear mass, no other evidence of a primary. 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 Her exam improved from preop, but she was still comatose and extending on one side. She also required phenylepherine pre and post-operatively for 48h but this was weaned off. This was discussed with the patient's family and compassionate withdrawal of care was recommended. Palliative care was consulted, who helped the family extensively with navigating their options. Given her very poor prognosis, she was compassionately extubated on 01/12/2019. Palliative care assisted with palliative measures and the patient ultimately expired on 01-24-2019.  Discharge diagnosis: Brain tumor, intracerebral hemorrhage  Judith Part, MD 01/04/19 10:45 AM

## 2019-01-05 DIAGNOSIS — I619 Nontraumatic intracerebral hemorrhage, unspecified: Secondary | ICD-10-CM

## 2019-01-05 DIAGNOSIS — Z7189 Other specified counseling: Secondary | ICD-10-CM

## 2019-01-05 DIAGNOSIS — Z9911 Dependence on respirator [ventilator] status: Secondary | ICD-10-CM

## 2019-01-05 LAB — GLUCOSE, CAPILLARY
Glucose-Capillary: 110 mg/dL — ABNORMAL HIGH (ref 70–99)
Glucose-Capillary: 93 mg/dL (ref 70–99)
Glucose-Capillary: 104 mg/dL — ABNORMAL HIGH (ref 70–99)
Glucose-Capillary: 113 mg/dL — ABNORMAL HIGH (ref 70–99)
Glucose-Capillary: 111 mg/dL — ABNORMAL HIGH (ref 70–99)
Glucose-Capillary: 130 mg/dL — ABNORMAL HIGH (ref 70–99)

## 2019-01-05 LAB — MAGNESIUM
Magnesium: 1.7 mg/dL (ref 1.7–2.4)
Magnesium: 2.1 mg/dL (ref 1.7–2.4)

## 2019-01-05 LAB — CBC
HCT: 22.7 % — ABNORMAL LOW (ref 36.0–46.0)
Hemoglobin: 7.4 g/dL — ABNORMAL LOW (ref 12.0–15.0)
MCH: 31.9 pg (ref 26.0–34.0)
MCHC: 32.6 g/dL (ref 30.0–36.0)
MCV: 97.8 fL (ref 80.0–100.0)
Platelets: 281 10*3/uL (ref 150–400)
RBC: 2.32 MIL/uL — ABNORMAL LOW (ref 3.87–5.11)
RDW: 14.2 % (ref 11.5–15.5)
WBC: 9 10*3/uL (ref 4.0–10.5)
nRBC: 0 % (ref 0.0–0.2)

## 2019-01-05 LAB — BASIC METABOLIC PANEL
Anion gap: 8 (ref 5–15)
BUN: <5 mg/dL — ABNORMAL LOW (ref 8–23)
CO2: 23 mmol/L (ref 22–32)
Calcium: 7.6 mg/dL — ABNORMAL LOW (ref 8.9–10.3)
Chloride: 105 mmol/L (ref 98–111)
Creatinine, Ser: <0.3 mg/dL — ABNORMAL LOW (ref 0.44–1.00)
Glucose, Bld: 125 mg/dL — ABNORMAL HIGH (ref 70–99)
Potassium: 3.7 mmol/L (ref 3.5–5.1)
Sodium: 136 mmol/L (ref 135–145)

## 2019-01-05 LAB — PHOSPHORUS
Phosphorus: 3 mg/dL (ref 2.5–4.6)
Phosphorus: 3.4 mg/dL (ref 2.5–4.6)

## 2019-01-05 MED ORDER — MAGNESIUM SULFATE 2 GM/50ML IV SOLN
2.0000 g | Freq: Once | INTRAVENOUS | Status: AC
  Administered 2019-01-05: 2 g via INTRAVENOUS
  Filled 2019-01-05: qty 50

## 2019-01-05 MED ORDER — FUROSEMIDE 10 MG/ML IJ SOLN
40.0000 mg | Freq: Once | INTRAMUSCULAR | Status: AC
  Administered 2019-01-05: 40 mg via INTRAVENOUS
  Filled 2019-01-05: qty 4

## 2019-01-05 MED ORDER — POTASSIUM & SODIUM PHOSPHATES 280-160-250 MG PO PACK
1.0000 | PACK | Freq: Three times a day (TID) | ORAL | Status: DC
  Administered 2019-01-05 – 2019-01-12 (×29): 1
  Filled 2019-01-05 (×32): qty 1

## 2019-01-05 NOTE — Progress Notes (Signed)
Neurosurgery Service Progress Note  Subjective: No acute events overnight   Objective: Vitals:   01/05/19 0500 01/05/19 0600 01/05/19 0800 01/05/19 0812  BP: 100/65 108/72 120/83   Pulse: 84 88 92   Resp: 13 16 18   Temp: 98.1 F (36.7 C) 98.1 F (36.7 C) 97.9 F (36.6 C)   TempSrc:      SpO2: 100% 100% 100% 100%  Weight:      Height:       Temp (24hrs), Avg:98.7 F (37.1 C), Min:97.5 F (36.4 C), Max:100 F (37.8 C)  CBC Latest Ref Rng & Units 01/05/2019 01/04/2019 01/03/2019  WBC 4.0 - 10.5 K/uL 9.0 9.4 10.3  Hemoglobin 12.0 - 15.0 g/dL 7.4(L) 8.3(L) 7.2(L)  Hematocrit 36.0 - 46.0 % 22.7(L) 25.2(L) 21.7(L)  Platelets 150 - 400 K/uL 281 296 203   BMP Latest Ref Rng & Units 01/05/2019 01/04/2019 01/03/2019  Glucose 70 - 99 mg/dL 125(H) 120(H) 129(H)  BUN 8 - 23 mg/dL <5(L) 5(L) 8  Creatinine 0.44 - 1.00 mg/dL <0.30(L) <0.30(L) 0.35(L)  BUN/Creat Ratio 12 - 28 - - -  Sodium 135 - 145 mmol/L 136 141 141  Potassium 3.5 - 5.1 mmol/L 3.7 4.0 3.5  Chloride 98 - 111 mmol/L 105 108 115(H)  CO2 22 - 32 mmol/L 23 22 23  Calcium 8.9 - 10.3 mg/dL 7.6(L) 7.8(L) 7.2(L)    Intake/Output Summary (Last 24 hours) at 01/05/2019 0835 Last data filed at 01/05/2019 0800 Gross per 24 hour  Intake 3358.87 ml  Output 2340 ml  Net 1018.87 ml    Current Facility-Administered Medications:  .  0.9 %  sodium chloride infusion, , Intravenous, Continuous, ,  A, MD, Last Rate: 75 mL/hr at 01/05/19 0700, Rate Verify at 01/05/19 0700 .  acetaminophen (TYLENOL) tablet 650 mg, 650 mg, Oral, Q4H PRN **OR** acetaminophen (TYLENOL) suppository 650 mg, 650 mg, Rectal, Q4H PRN, ,  A, MD .  cefTRIAXone (ROCEPHIN) 2 g in sodium chloride 0.9 % 100 mL IVPB, 2 g, Intravenous, Q24H, Smith, Daniel C, MD, Stopped at 01/04/19 1206 .  chlorhexidine gluconate (MEDLINE KIT) (PERIDEX) 0.12 % solution 15 mL, 15 mL, Mouth Rinse, BID, ,  A, MD, 15 mL at 01/04/19 2017 .   Chlorhexidine Gluconate Cloth 2 % PADS 6 each, 6 each, Topical, Daily, ,  A, MD, 6 each at 01/04/19 2132 .  feeding supplement (OSMOLITE 1.2 CAL) liquid 1,000 mL, 1,000 mL, Per Tube, Continuous, Byrum, Robert S, MD, Last Rate: 55 mL/hr at 01/04/19 0832, 1,000 mL at 01/04/19 0832 .  fentaNYL (SUBLIMAZE) injection 50 mcg, 50 mcg, Intravenous, Q15 min PRN, Stretch, Robert J, MD, 50 mcg at 01/01/19 0244 .  fentaNYL (SUBLIMAZE) injection 50-200 mcg, 50-200 mcg, Intravenous, Q30 min PRN, Stretch, Robert J, MD, 100 mcg at 01/04/19 1845 .  heparin injection 5,000 Units, 5,000 Units, Subcutaneous, Q8H, ,  A, MD, 5,000 Units at 01/05/19 0511 .  insulin aspart (novoLOG) injection 0-24 Units, 0-24 Units, Subcutaneous, Q4H, Smith, Daniel C, MD, 2 Units at 01/04/19 1138 .  labetalol (NORMODYNE) injection 10-40 mg, 10-40 mg, Intravenous, Q10 min PRN, ,  A, MD, 10 mg at 01/04/19 0130 .  MEDLINE mouth rinse, 15 mL, Mouth Rinse, 10 times per day, ,  A, MD, 15 mL at 01/05/19 0558 .  multivitamin with minerals tablet 1 tablet, 1 tablet, Per Tube, Daily, Byrum, Robert S, MD, 1 tablet at 01/04/19 1138 .  ondansetron (ZOFRAN) tablet 4 mg, 4 mg, Oral, Q4H PRN **OR** ondansetron (  ZOFRAN) injection 4 mg, 4 mg, Intravenous, Q4H PRN, ,  A, MD .  pantoprazole (PROTONIX) injection 40 mg, 40 mg, Intravenous, QHS, Smith, Daniel C, MD, 40 mg at 01/04/19 2131 .  phenylephrine (NEOSYNEPHRINE) 10-0.9 MG/250ML-% infusion, 0-400 mcg/min, Intravenous, Titrated, ,  A, MD, Stopped at 01/04/19 1204 .  polyethylene glycol (MIRALAX / GLYCOLAX) packet 17 g, 17 g, Oral, Daily PRN, ,  A, MD .  promethazine (PHENERGAN) tablet 12.5-25 mg, 12.5-25 mg, Oral, Q4H PRN, ,  A, MD   Physical Exam: Intubated, left eye tries to open but not fully opening to painful stimulus, not FC, pupils 3-->2, +R 4th nerve palsy, pupil neutral position  on left, not FC, minimal withdrawing on left and extending on right Incision / drain site c/d/i  Assessment & Plan: 63 y.o. 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 12/12 post-op MRI w/ expected residual tumor in brainstem, good debulking, scattered areas of diffusion changes including posterior pons likely 2/2 hemorrhage event, unclear pattern. 12/14 EEG w/ some focal spikes, no epileptiform activity, CT CAP with consolidation, no primary tumor, 12/15 rpt CTH w/ improved shift / pneumocephalus, ventriculomegaly, 12/16 rpt CTH slightly increased R subdural collection, EVD raised to +15  -CCM recs -cont EVD at +15, will start to wean drain next week -d/c keppra in case this is clouding exam at all -SCDs/TEDs, SQH -discussed extensively with multiple family members yesterday, I think she is going to have a very poor functional outcome, which I explained to them in great detail. They would like to continue with supportive care until intubation day 10, which is 12/21, to decide on withdrawal of care versus trach & PEG.    A   01/05/19 8:35 AM  

## 2019-01-05 NOTE — Progress Notes (Signed)
NAME:  Jasmine [name], MRN:  [id number], DOB:  1955/08/13, LOS: 6 ADMISSION DATE:  01/12/2019, CONSULTATION DATE:  01/01/19 REFERRING MD:  Zada Finders, CHIEF COMPLAINT:  Comatose   Brief History   63 year old woman with a history of possible dementia presenting with progressive neurological decline found to have large hemorrhagic right sided intracranial mass status post craniotomy and persistent respiratory failure postop.  History of present illness   63 year old woman with a history of possible dementia presenting with progressive neurological decline found to have large hemorrhagic right sided intracranial mass status post craniotomy and persistent respiratory failure postop.  After discussion with husband regarding poor prognosis, patient was taken to the operating room for debulking, hematoma removal, and EVD placement by Dr. Zada Finders on 01/01/2019.  She remains on the ventilator postop and PCCM consulted to help manage.  Past Medical History  Dementia versus enlarging intracranial mass  Significant Hospital Events   01/11/2019 craniotomy and right intracranial tumor resection  Consults:  PCCM Palliative Care  Procedures:  See significant Hospital events  Significant Diagnostic Tests:     Micro Data:  COVID Neg Blood cultures x 2 >>  Antimicrobials:  Ceftriaxone 12/13 >>   Interim history/subjective:  CTH yesterday: right frontal SDH slightly larger, decreased subdural gas on right.  Stable right thalamic ICH.  Mass effect, 10 mm shift unchanged.   Objective   Blood pressure 120/83, pulse 92, temperature 97.9 F (36.6 C), resp. rate 18, height 5\' 6"  (1.676 m), weight 37.9 kg, SpO2 100 %.    Vent Mode: PSV;CPAP FiO2 (%):  [40 %] 40 % Set Rate:  [10 bmp] 10 bmp Vt Set:  [470 mL] 470 mL PEEP:  [5 cmH20] 5 cmH20 Pressure Support:  [10 cmH20] 10 cmH20 Plateau Pressure:  [17 cmH20-21 cmH20] 19 cmH20   Intake/Output Summary (Last 24 hours) at 01/05/2019 0841 Last  data filed at 01/05/2019 0800 Gross per 24 hour  Intake 3358.87 ml  Output 2340 ml  Net 1018.87 ml   Filed Weights   01/02/19 0500 01/03/19 0453 01/04/19 0500  Weight: 38.8 kg 38.4 kg 37.9 kg    Examination: General: Adult female, intubated without continuous sedation HENT: EVD + 15.  Incision c/d/i. Trachea midline.  Lungs: Mechanical ventilation, PSV 10/5, symmetric expansion, minimal ETT secretions, copious clear oral secretions.   Cardiovascular: SR on telemetry, warm and well perfused, trace edema to LE Abdomen: soft, non tender, non distended Extremities: bilateral foot drop Neuro: Non eye opening, not following commands. Eyes disconjugate with upward gaze.   Skin: Dressing to sacral decubitus   Resolved Hospital Problem list     Assessment & Plan:   # Postoperative respiratory failure  -related to inability to protect airway and likely aspiration # Abnormal CT chest consistent with left lower lobe pneumonia.  P -Rocephin D4 for CAP, d/c tomorrow -Pending GOC discussions, will need repeat CT to follow up infiltrate vs lesion after abx  -Continue mechanical ventilation, patient tolerates PSV 10/5.  Oral secretions and neurological exam excludes extubation  # Hemorrhagic intracranial mass  -with pathology pending, status post debulking, residual tumor in the brainstem -Poor prognosis  P -EVD: +15 per NSGY, drain weaning to start next week -Ongoing goals of care per NSGY  # Shock, resolved -Off vasopressors 12/16, discontinue IVF  # Progressive dementia  -that was probably more related to intracranial mass  # Severe protein calorie malnutrition - present on admission P: -Continue nutritional support with tube feeds  #Hypophosphatemia #Hypomagnesium  -Replaced  #  Stage 3 pressure ulcer- sacral, present on admission -WOC following  Best practice:  Diet: TF Pain/Anxiety/Delirium protocol (if indicated): fentanyl prn VAP protocol (if indicated): In  place DVT prophylaxis: SCDs, subq Heparin GI prophylaxis: PPI Glucose control: SSI Mobility: BR Code Status: Full Family Communication: discussions undertaken by Dr Zada Finders, Palliative Care involved.  Plan to have decision reagrding trach vs comfort care 12/21 Disposition: ICU  Labs   CBC: Recent Labs  Lab 01/19/2019 1904 01/02/19 0322 01/02/19 0356 01/03/19 0447 01/04/19 0500 01/05/19 0513  WBC 15.0* QUESTIONABLE RESULTS, RECOMMEND RECOLLECT TO VERIFY 16.4* 10.3 9.4 9.0  NEUTROABS 11.8*  --   --   --   --   --   HGB 13.4 QUESTIONABLE RESULTS, RECOMMEND RECOLLECT TO VERIFY 8.1* 7.2* 8.3* 7.4*  HCT 41.2 QUESTIONABLE RESULTS, RECOMMEND RECOLLECT TO VERIFY 24.6* 21.7* 25.2* 22.7*  MCV 97.9 QUESTIONABLE RESULTS, RECOMMEND RECOLLECT TO VERIFY 98.0 96.4 98.4 97.8  PLT 354 QUESTIONABLE RESULTS, RECOMMEND RECOLLECT TO VERIFY 197 203 296 [id number]    Basic Metabolic Panel: Recent Labs  Lab 01/02/19 0322 01/02/19 0356 01/03/19 0447 01/03/19 1700 01/04/19 0500 01/04/19 1837 01/05/19 0513  NA QUESTIONABLE RESULTS, RECOMMEND RECOLLECT TO VERIFY 140 141  --  141  --  136  K QUESTIONABLE RESULTS, RECOMMEND RECOLLECT TO VERIFY 3.8 3.5  --  4.0  --  3.7  CL QUESTIONABLE RESULTS, RECOMMEND RECOLLECT TO VERIFY 115* 115*  --  108  --  105  CO2 QUESTIONABLE RESULTS, RECOMMEND RECOLLECT TO VERIFY 18* 23  --  22  --  23  GLUCOSE QUESTIONABLE RESULTS, RECOMMEND RECOLLECT TO VERIFY 119* 129*  --  120*  --  125*  BUN QUESTIONABLE RESULTS, RECOMMEND RECOLLECT TO VERIFY 11 8  --  5*  --  <5*  CREATININE QUESTIONABLE RESULTS, RECOMMEND RECOLLECT TO VERIFY <0.30* 0.35*  --  <0.30*  --  <0.30*  CALCIUM QUESTIONABLE RESULTS, RECOMMEND RECOLLECT TO VERIFY 7.5* 7.2*  --  7.8*  --  7.6*  MG QUESTIONABLE RESULTS, RECOMMEND RECOLLECT TO VERIFY  --  1.8 1.8 1.7 1.7 1.7  PHOS QUESTIONABLE RESULTS, RECOMMEND RECOLLECT TO VERIFY  --  1.4* 3.9 3.3 3.1 3.0   GFR: CrCl cannot be calculated (This lab value cannot be  used to calculate CrCl because it is not a number: <0.30). Recent Labs  Lab 01/06/2019 2005 01/02/19 0356 01/03/19 0447 01/04/19 0500 01/05/19 0513  WBC  --  16.4* 10.3 9.4 9.0  LATICACIDVEN 1.2  --   --   --   --     Liver Function Tests: Recent Labs  Lab 12/28/2018 1904  AST 35  ALT 27  ALKPHOS 81  BILITOT 0.5  PROT 7.1  ALBUMIN 3.0*   No results for input(s): LIPASE, AMYLASE in the last 168 hours. No results for input(s): AMMONIA in the last 168 hours.  ABG    Component Value Date/Time   PHART 7.430 01/01/2019 1101   PCO2ART 29.2 (L) 01/01/2019 1101   PO2ART 171.0 (H) 01/01/2019 1101   HCO3 19.3 (L) 01/01/2019 1101   TCO2 20 (L) 01/01/2019 1101   ACIDBASEDEF 4.0 (H) 01/01/2019 1101   O2SAT 100.0 01/01/2019 1101     Coagulation Profile: Recent Labs  Lab 01/05/2019 0331  INR 1.2    Cardiac Enzymes: No results for input(s): CKTOTAL, CKMB, CKMBINDEX, TROPONINI in the last 168 hours.  HbA1C: No results found for: HGBA1C  CBG: Recent Labs  Lab 01/04/19 1514 01/04/19 1958 01/04/19 2306 01/05/19 0312 01/05/19 [place]  [place], ACNP [facility]  Pager: [phone] After hours pager: [phone]

## 2019-01-06 ENCOUNTER — Inpatient Hospital Stay (HOSPITAL_COMMUNITY): Payer: BLUE CROSS/BLUE SHIELD

## 2019-01-06 LAB — CBC
HCT: 23.6 % — ABNORMAL LOW (ref 36.0–46.0)
Hemoglobin: 7.9 g/dL — ABNORMAL LOW (ref 12.0–15.0)
MCH: 32 pg (ref 26.0–34.0)
MCHC: 33.5 g/dL (ref 30.0–36.0)
MCV: 95.5 fL (ref 80.0–100.0)
Platelets: 316 10*3/uL (ref 150–400)
RBC: 2.47 MIL/uL — ABNORMAL LOW (ref 3.87–5.11)
RDW: 13.8 % (ref 11.5–15.5)
WBC: 13.7 10*3/uL — ABNORMAL HIGH (ref 4.0–10.5)
nRBC: 0 % (ref 0.0–0.2)

## 2019-01-06 LAB — PHOSPHORUS
Phosphorus: 3.7 mg/dL (ref 2.5–4.6)
Phosphorus: 3.6 mg/dL (ref 2.5–4.6)

## 2019-01-06 LAB — BASIC METABOLIC PANEL
Anion gap: 7 (ref 5–15)
BUN: 6 mg/dL — ABNORMAL LOW (ref 8–23)
CO2: 26 mmol/L (ref 22–32)
Calcium: 7.5 mg/dL — ABNORMAL LOW (ref 8.9–10.3)
Chloride: 102 mmol/L (ref 98–111)
Creatinine, Ser: 0.38 mg/dL — ABNORMAL LOW (ref 0.44–1.00)
GFR calc Af Amer: >60 mL/min (ref >60)
GFR calc non Af Amer: >60 mL/min (ref >60)
Glucose, Bld: 122 mg/dL — ABNORMAL HIGH (ref 70–99)
Potassium: 4 mmol/L (ref 3.5–5.1)
Sodium: 135 mmol/L (ref 135–145)

## 2019-01-06 LAB — GLUCOSE, CAPILLARY
Glucose-Capillary: 89 mg/dL (ref 70–99)
Glucose-Capillary: 134 mg/dL — ABNORMAL HIGH (ref 70–99)
Glucose-Capillary: 87 mg/dL (ref 70–99)
Glucose-Capillary: 118 mg/dL — ABNORMAL HIGH (ref 70–99)
Glucose-Capillary: 143 mg/dL — ABNORMAL HIGH (ref 70–99)
Glucose-Capillary: 95 mg/dL (ref 70–99)

## 2019-01-06 LAB — MAGNESIUM
Magnesium: 2.1 mg/dL (ref 1.7–2.4)
Magnesium: 1.8 mg/dL (ref 1.7–2.4)

## 2019-01-06 MED ORDER — ACETAMINOPHEN 160 MG/5ML PO SOLN
650.0000 mg | ORAL | Status: DC | PRN
  Administered 2019-01-06 – 2019-01-12 (×11): 650 mg
  Filled 2019-01-06 (×11): qty 20.3

## 2019-01-06 NOTE — Progress Notes (Signed)
Neurosurgery Service Progress Note  Subjective: No acute events overnight   Objective: Vitals:   01/06/19 0731 01/06/19 0800 01/06/19 0815 01/06/19 0830  BP:  101/70  115/75  Pulse:  (!) 104 (!) 108 (!) 108  Resp:  (!) 25 (!) 23 (!) 22  Temp:  99.5 F (37.5 C) 99.5 F (37.5 C) 99.3 F (37.4 C)  TempSrc:      SpO2: 100% 100% 99% 100%  Weight:      Height:       Temp (24hrs), Avg:99.2 F (37.3 C), Min:97.7 F (36.5 C), Max:100.8 F (38.2 C)  CBC Latest Ref Rng & Units 01/06/2019 01/05/2019 01/04/2019  WBC 4.0 - 10.5 K/uL 13.7(H) 9.0 9.4  Hemoglobin 12.0 - 15.0 g/dL 7.9(L) 7.4(L) 8.3(L)  Hematocrit 36.0 - 46.0 % 23.6(L) 22.7(L) 25.2(L)  Platelets 150 - 400 K/uL 316 281 296   BMP Latest Ref Rng & Units 01/06/2019 01/05/2019 01/04/2019  Glucose 70 - 99 mg/dL 122(H) 125(H) 120(H)  BUN 8 - 23 mg/dL 6(L) <5(L) 5(L)  Creatinine 0.44 - 1.00 mg/dL 0.38(L) <0.30(L) <0.30(L)  BUN/Creat Ratio 12 - 28 - - -  Sodium 135 - 145 mmol/L 135 136 141  Potassium 3.5 - 5.1 mmol/L 4.0 3.7 4.0  Chloride 98 - 111 mmol/L 102 105 108  CO2 22 - 32 mmol/L 26 23 22   Calcium 8.9 - 10.3 mg/dL 7.5(L) 7.6(L) 7.8(L)    Intake/Output Summary (Last 24 hours) at 01/06/2019 0854 Last data filed at 01/06/2019 0755 Gross per 24 hour  Intake 1481.96 ml  Output 5553 ml  Net -4071.04 ml    Current Facility-Administered Medications:  .  acetaminophen (TYLENOL) tablet 650 mg, 650 mg, Oral, Q4H PRN **OR** acetaminophen (TYLENOL) suppository 650 mg, 650 mg, Rectal, Q4H PRN, Judith Part, MD .  chlorhexidine gluconate (MEDLINE KIT) (PERIDEX) 0.12 % solution 15 mL, 15 mL, Mouth Rinse, BID, Jaysin Gayler, Joyice Faster, MD, 15 mL at 01/06/19 0753 .  Chlorhexidine Gluconate Cloth 2 % PADS 6 each, 6 each, Topical, Daily, Judith Part, MD, 6 each at 01/06/19 0400 .  feeding supplement (OSMOLITE 1.2 CAL) liquid 1,000 mL, 1,000 mL, Per Tube, Continuous, Collene Gobble, MD, Last Rate: 55 mL/hr at 01/06/19 0359,  1,000 mL at 01/06/19 0359 .  fentaNYL (SUBLIMAZE) injection 50 mcg, 50 mcg, Intravenous, Q15 min PRN, Stretch, Marily Lente, MD, 50 mcg at 01/01/19 0244 .  fentaNYL (SUBLIMAZE) injection 50-200 mcg, 50-200 mcg, Intravenous, Q30 min PRN, Stretch, Marily Lente, MD, 100 mcg at 01/04/19 1845 .  heparin injection 5,000 Units, 5,000 Units, Subcutaneous, Q8H, Judith Part, MD, 5,000 Units at 01/06/19 0615 .  insulin aspart (novoLOG) injection 0-24 Units, 0-24 Units, Subcutaneous, Q4H, Candee Furbish, MD, 2 Units at 01/06/19 0801 .  labetalol (NORMODYNE) injection 10-40 mg, 10-40 mg, Intravenous, Q10 min PRN, Judith Part, MD, 10 mg at 01/04/19 0130 .  MEDLINE mouth rinse, 15 mL, Mouth Rinse, 10 times per day, Judith Part, MD, 15 mL at 01/06/19 9163 .  multivitamin with minerals tablet 1 tablet, 1 tablet, Per Tube, Daily, Collene Gobble, MD, 1 tablet at 01/05/19 0940 .  ondansetron (ZOFRAN) tablet 4 mg, 4 mg, Oral, Q4H PRN **OR** ondansetron (ZOFRAN) injection 4 mg, 4 mg, Intravenous, Q4H PRN, Atiana Levier A, MD .  pantoprazole (PROTONIX) injection 40 mg, 40 mg, Intravenous, QHS, Candee Furbish, MD, 40 mg at 01/05/19 2220 .  polyethylene glycol (MIRALAX / GLYCOLAX) packet 17 g, 17 g, Oral, Daily  PRN, Judith Part, MD .  potassium & sodium phosphates (PHOS-NAK) 280-160-250 MG packet 1 packet, 1 packet, Per Tube, TID WC & HS, Paulita Fujita B, NP, 1 packet at 01/06/19 0801 .  promethazine (PHENERGAN) tablet 12.5-25 mg, 12.5-25 mg, Oral, Q4H PRN, Judith Part, MD   Physical Exam: Intubated, left eye opens to stim, not FC, pupils 3-->2, +R 4th nerve palsy, pupil neutral position on left, not FC, minimal withdrawing on left and extending on right with increased tone Incision / drain site c/d/i  Assessment & Plan: 63 y.o. woman w/ progressive dementia then acute obtundation, CT w/ large R hemorrhagic hemispheric mass invading thalamus w/ associated hemorrhage &  hydrocephalus, 12/12 s/p L F EVD, 12/12 s/p craniotomy for R tumor debulking and hematoma evacuation. 12/12 post-op MRI w/ expected residual tumor in brainstem, good debulking, scattered areas of diffusion changes including posterior pons likely 2/2 hemorrhage event, unclear pattern. 12/14 EEG w/ some focal spikes, no epileptiform activity, CT CAP with consolidation, no primary tumor, 12/15 rpt CTH w/ improved shift / pneumocephalus, ventriculomegaly, 12/16 rpt CTH slightly increased R subdural collection, EVD raised to +15  -CCM recs -will try and wean EVD - CT head today for baseline ventricular size then clamp EVD, if she needs to be reopened then will open at +15 given the subdural -path still pending -SCDs/TEDs, SQH -family to decide about withdrawal or trach/PEG on 12/21  Judith Part  01/06/19 8:54 AM

## 2019-01-06 NOTE — Progress Notes (Signed)
Patient transported to CT and back to room [id number] without complications.  Patient placed back on full support ventilation for transport.  Tolerating well at this time.  Will continue to monitor.

## 2019-01-06 NOTE — Progress Notes (Signed)
NAME:  Jasmine [name], MRN:  [id number], DOB:  02-Sep-1955, LOS: 7 ADMISSION DATE:  01/09/2019, CONSULTATION DATE:  01/01/19 REFERRING MD:  Zada Finders, CHIEF COMPLAINT:  Comatose   Brief History   63 year old woman with a history of possible dementia presenting with progressive neurological decline found to have large hemorrhagic right sided intracranial mass status post craniotomy and persistent respiratory failure postop.  History of present illness   63 year old woman with a history of possible dementia presenting with progressive neurological decline found to have large hemorrhagic right sided intracranial mass status post craniotomy and persistent respiratory failure postop.  After discussion with husband regarding poor prognosis, patient was taken to the operating room for debulking, hematoma removal, and EVD placement by Dr. Zada Finders on 01/18/2019.  She remains on the ventilator postop and PCCM consulted to help manage.  Past Medical History  Dementia versus enlarging intracranial mass  Significant Hospital Events   01/17/2019 craniotomy and right intracranial tumor resection  Consults:  PCCM Palliative Care  Procedures:  See significant Hospital events  Significant Diagnostic Tests:    Micro Data:  12/11 COVID >  Neg 12/11 Blood cultures x 2 > Neg  Antimicrobials:  Ceftriaxone 12/13 >>   Interim history/subjective:  No overnight events  Objective   Blood pressure 109/85, pulse (!) 117, temperature 100.2 F (37.9 C), resp. rate 16, height 5\' 6"  (1.676 m), weight 46.8 kg, SpO2 100 %.    Vent Mode: PRVC FiO2 (%):  [40 %] 40 % Set Rate:  [10 bmp] 10 bmp Vt Set:  [470 mL] 470 mL PEEP:  [5 cmH20] 5 cmH20 Plateau Pressure:  [18 cmH20-27 cmH20] 23 cmH20   Intake/Output Summary (Last 24 hours) at 01/06/2019 1338 Last data filed at 01/06/2019 1300 Gross per 24 hour  Intake 1588.17 ml  Output 5943 ml  Net -4354.83 ml   Filed Weights   01/03/19 0453 01/04/19 0500  01/06/19 0500  Weight: 38.4 kg 37.9 kg 46.8 kg   Physical Exam: General: Well-appearing, unresponsive HENT: [place], AT, ETT in place Eyes: EOMI, no scleral icterus Respiratory: Upper airway rhonchi with anterior lung fields clearr to auscultation bilaterally.  No crackles, wheezing or rales Cardiovascular: RRR, -M/R/G, no JVD GI: BS+, soft, nontender Extremities:-Edema,-tenderness Neuro: Unresponsive off sedation, no withdrawal. Right eye open but does not respond to threat Skin: Intact, no rashes or bruising Psych: Normal mood, normal affect GU: Foley in place  Resolved Hospital Problem list   Septic shock  Assessment & Plan:  63 year old female with intracranial mass s/p intervention with minimal to no meaningful neurologic recovery off of sedation. She is not a candidate for extubation due to inability to protect her airway. Neurosurgery and Palliative care team have held discussions regarding goals of care with pending decision on 01/09/19. If family wishes to pursue aggressive care (tracheostomy, PEG), she will likely remain dependent for care and will require long term facility placement.  # Postoperative respiratory failure  -related to inability to protect airway and likely aspiration # Community-acquired pneumonia s/p antibiotics  P: -Full vent support 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  -Unclear etiology however suspect probably more related to intracranial mass  # Severe protein calorie malnutrition - present on admission P: -Continue  nutritional support with tube feeds  # Stage 3 pressure ulcer- sacral, present on admission -WOC following  Best practice:  Diet:  TF Pain/Anxiety/Delirium protocol (if indicated): fentanyl prn VAP protocol (if indicated): In place DVT prophylaxis: SCDs, subq Heparin GI prophylaxis: PPI Glucose control: SSI Mobility: BR Code Status: Full Family Communication: discussions undertaken by Dr Zada Finders, Palliative Care involved.  Plan to have decision reagrding trach vs comfort care 12/21 Disposition: ICU  Labs   CBC: Recent Labs  Lab 01/08/2019 1904 01/15/2019 1905 01/02/19 0356 01/03/19 0447 01/04/19 0500 01/05/19 0513 01/06/19 0602  WBC 15.0*   < > 16.4* 10.3 9.4 9.0 13.7*  NEUTROABS 11.8*  --   --   --   --   --   --   HGB 13.4  --  8.1* 7.2* 8.3* 7.4* 7.9*  HCT 41.2  --  24.6* 21.7* 25.2* 22.7* 23.6*  MCV 97.9   < > 98.0 96.4 98.4 97.8 95.5  PLT 354   < > 197 203 296 281 316   < > = values in this interval not displayed.    Basic Metabolic Panel: Recent Labs  Lab 01/02/19 0356 01/03/19 0447 01/04/19 0500 01/04/19 1837 01/05/19 0513 01/05/19 1713 01/06/19 0602  NA 140 141 141  --  136  --  135  K 3.8 3.5 4.0  --  3.7  --  4.0  CL 115* 115* 108  --  105  --  102  CO2 18* 23 22  --  23  --  26  GLUCOSE 119* 129* 120*  --  125*  --  122*  BUN 11 8 5*  --  <5*  --  6*  CREATININE <0.30* 0.35* <0.30*  --  <0.30*  --  0.38*  CALCIUM 7.5* 7.2* 7.8*  --  7.6*  --  7.5*  MG  --  1.8 1.7 1.7 1.7 2.1 2.1  PHOS  --  1.4* 3.3 3.1 3.0 3.4 3.7   GFR: Estimated Creatinine Clearance: 53.2 mL/min (A) (by C-G formula based on SCr of 0.38 mg/dL (L)). Recent Labs  Lab 01/17/2019 2005 01/03/19 0447 01/04/19 0500 01/05/19 0513 01/06/19 0602  WBC  --  10.3 9.4 9.0 13.7*  LATICACIDVEN 1.2  --   --   --   --     Liver Function Tests: Recent Labs  Lab 12/21/2018 1904  AST 35  ALT 27  ALKPHOS 81  BILITOT 0.5  PROT 7.1  ALBUMIN 3.0*   No results for input(s): LIPASE, AMYLASE in the last 168 hours. No results for input(s): AMMONIA in the last 168 hours.  ABG    Component Value Date/Time   PHART 7.430  01/01/2019 1101   PCO2ART 29.2 (L) 01/01/2019 1101   PO2ART 171.0 (H) 01/01/2019 1101   HCO3 19.3 (L) 01/01/2019 1101   TCO2 20 (L) 01/01/2019 1101   ACIDBASEDEF 4.0 (H) 01/01/2019 1101   O2SAT 100.0 01/01/2019 1101     Coagulation Profile: Recent Labs  Lab 12/22/2018 0331  INR 1.2    Cardiac Enzymes: No results for input(s): CKTOTAL, CKMB, CKMBINDEX, TROPONINI in the last 168 hours.  HbA1C: No results found for: HGBA1C  CBG: Recent Labs  Lab 01/05/19 1908 01/05/19 2313 01/06/19 0315 01/06/19 0733 01/06/19 1229  GLUCAP 111* 130* 89 134* 87   The patient is critically ill with multiple organ systems failure and requires high complexity decision making for assessment and support, frequent evaluation and titration of therapies, application of advanced monitoring technologies  and extensive interpretation of multiple databases.   Critical Care Time devoted to patient care services described in this note is 31 minutes.   Rodman Pickle, M.D. [facility] 01/06/2019 1:38 PM   Please see Amion for pager number to reach on-call Pulmonary and Critical Care Team.

## 2019-01-06 NOTE — Progress Notes (Signed)
Notified Dr. Zada Finders of critical CT results. MD updated and will review CT. Given instructions to continue with clamping EVD.

## 2019-01-07 DIAGNOSIS — E43 Unspecified severe protein-calorie malnutrition: Secondary | ICD-10-CM

## 2019-01-07 DIAGNOSIS — D496 Neoplasm of unspecified behavior of brain: Secondary | ICD-10-CM

## 2019-01-07 LAB — GLUCOSE, CAPILLARY
Glucose-Capillary: 128 mg/dL — ABNORMAL HIGH (ref 70–99)
Glucose-Capillary: 79 mg/dL (ref 70–99)
Glucose-Capillary: 120 mg/dL — ABNORMAL HIGH (ref 70–99)
Glucose-Capillary: 110 mg/dL — ABNORMAL HIGH (ref 70–99)
Glucose-Capillary: 119 mg/dL — ABNORMAL HIGH (ref 70–99)
Glucose-Capillary: 121 mg/dL — ABNORMAL HIGH (ref 70–99)

## 2019-01-07 LAB — CBC
HCT: 22.4 % — ABNORMAL LOW (ref 36.0–46.0)
Hemoglobin: 7.5 g/dL — ABNORMAL LOW (ref 12.0–15.0)
MCH: 31.6 pg (ref 26.0–34.0)
MCHC: 33.5 g/dL (ref 30.0–36.0)
MCV: 94.5 fL (ref 80.0–100.0)
Platelets: 323 10*3/uL (ref 150–400)
RBC: 2.37 MIL/uL — ABNORMAL LOW (ref 3.87–5.11)
RDW: 13.6 % (ref 11.5–15.5)
WBC: 15.9 10*3/uL — ABNORMAL HIGH (ref 4.0–10.5)
nRBC: 0 % (ref 0.0–0.2)

## 2019-01-07 LAB — BASIC METABOLIC PANEL
Anion gap: 7 (ref 5–15)
BUN: 8 mg/dL (ref 8–23)
CO2: 25 mmol/L (ref 22–32)
Calcium: 7.8 mg/dL — ABNORMAL LOW (ref 8.9–10.3)
Chloride: 100 mmol/L (ref 98–111)
Creatinine, Ser: <0.3 mg/dL — ABNORMAL LOW (ref 0.44–1.00)
Glucose, Bld: 118 mg/dL — ABNORMAL HIGH (ref 70–99)
Potassium: 4 mmol/L (ref 3.5–5.1)
Sodium: 132 mmol/L — ABNORMAL LOW (ref 135–145)

## 2019-01-07 LAB — MAGNESIUM: Magnesium: 1.8 mg/dL (ref 1.7–2.4)

## 2019-01-07 LAB — PHOSPHORUS: Phosphorus: 3.4 mg/dL (ref 2.5–4.6)

## 2019-01-07 MED ORDER — PANTOPRAZOLE SODIUM 40 MG PO PACK
40.0000 mg | PACK | Freq: Every day | ORAL | Status: DC
  Administered 2019-01-07 – 2019-01-12 (×6): 40 mg
  Filled 2019-01-07 (×6): qty 20

## 2019-01-07 NOTE — Progress Notes (Signed)
No significant change in status.  Patient with some eye-opening but no evidence of awareness,.  Minimal flexion withdrawal on the left to noxious stimuli.  Some extension on the right.  Afebrile.  Vital signs are stable.  Drain output low.  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  Pathology is still pending although likelihood of salvageability is minimal.  We will continue supportive care for now.  Patient's family aware of the grim prognosis.  They are making decisions with regards to possible further care on Monday.

## 2019-01-07 NOTE — Progress Notes (Signed)
NAME:  Jasmine [name], MRN:  [id number], DOB:  January 27, 1955, LOS: 8 ADMISSION DATE:  01/03/2019, CONSULTATION DATE:  01/01/19 REFERRING MD:  Zada Finders, CHIEF COMPLAINT:  Comatose   Brief History   63 year old woman with a history of possible dementia presenting with progressive neurological decline found to have large hemorrhagic right sided intracranial mass status post craniotomy and persistent respiratory failure postop.  History of present illness   63 year old woman with a history of possible dementia presenting with progressive neurological decline found to have large hemorrhagic right sided intracranial mass status post craniotomy and persistent respiratory failure postop.  After discussion with husband regarding poor prognosis, patient was taken to the operating room for debulking, hematoma removal, and EVD placement by Dr. Zada Finders on 01/15/2019.  She remains on the ventilator postop and PCCM consulted to help manage.  Past Medical History  Dementia versus enlarging intracranial mass  Significant Hospital Events   01/13/2019 craniotomy and right intracranial tumor resection  Consults:  PCCM Palliative Care  Procedures:  See significant Hospital events  Significant Diagnostic Tests:  12/18 CT Head  Unchanged position of a left frontal approach ventricular catheter. The lateral ventricles have increased in size since head CT 01/04/2019, most appreciable at the level of the temporal horns.  Otherwise, no significant interval change. Hemorrhagic mass centered within the right thalamus. Associated mass effect upon the midbrain with 10 mm leftward midline shift. Persistent moderate intraventricular hemorrhage.  Micro Data:  12/11 COVID >  Neg 12/11 Blood cultures x 2 > Neg  Antimicrobials:  Ceftriaxone 12/13 >>12/17  Interim history/subjective:  No overnight events.  Low grade temp yesterday, none overnight.   Objective   Blood pressure 128/64, pulse 95,  temperature 98.8 F (37.1 C), resp. rate (!) 22, height 5\' 6"  (1.676 m), weight 46.8 kg, SpO2 100 %.    Vent Mode: PSV;CPAP FiO2 (%):  [40 %] 40 % Set Rate:  [10 bmp] 10 bmp Vt Set:  [470 mL] 470 mL PEEP:  [5 cmH20] 5 cmH20 Pressure Support:  [12 cmH20] 12 cmH20 Plateau Pressure:  [19 cmH20-29 cmH20] 19 cmH20   Intake/Output Summary (Last 24 hours) at 01/07/2019 0808 Last data filed at 01/07/2019 0600 Gross per 24 hour  Intake 1660 ml  Output 1802 ml  Net -142 ml   Filed Weights   01/03/19 0453 01/04/19 0500 01/06/19 0500  Weight: 38.4 kg 37.9 kg 46.8 kg   Physical Exam: General: Well-developed, well nourished female, intubated. HENT: Normocephalic. EVD in place draining clear pink tinged CSF.  PERRL-pinpoint. R upward gaze. Moist mucus membranes Neck: No JVD. Trachea midline. CV: RRR. S1S2. No MRG. +2 distal pulses Lungs: BBS present, clear, FNL, symmetrical. On PSV  ABD: +BS x4. SNT/ND. No masses, guarding. ABD is rigid with stimulation but relaxes  GU: Foley EXT:  2+ L foot edema, 1+ R foot edema. B/L upper EXT trace edema  Skin: PWD. In tact. No rashes or lesions Neuro: Withdraws briskly LLE and LUE. Delayed withdrawal RLE and RLE. Rigidity to passive movement of upper EXT. Blinks to threat. Eyes do not track    Resolved Hospital Problem list   Septic shock  Assessment & Plan:  63 year old female with intracranial mass s/p intervention with minimal to no meaningful neurologic recovery off of sedation. She is not a candidate for extubation due to inability to protect her airway. Neurosurgery and Palliative care team have held discussions regarding goals of care with pending decision on 01/09/19. If family wishes to pursue aggressive  care (tracheostomy, PEG), she will likely remain dependent for care and will require long term facility placement.  # Postoperative respiratory failure  -related to inability to protect airway and likely aspiration # Community-acquired  pneumonia s/p antibiotics  P: -Continue ventilator support to prevent eminent deterioration and further organ dysfunction from hypoxemia and hypercarbia.    -Maintain SpO2 greater than or equal to 90%. -Head of bed elevated 30 degrees. -Plateau pressures less than 30 cm H20.  Follow chest x-ray, ABG prn.   -Bronchial hygiene. -RT/bronchodilator protocol. 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  -Unclear etiology however suspect probably more related to intracranial mass  # Severe protein calorie malnutrition - present on admission P: -Continue nutritional support with tube feeds  # Stage 3 pressure ulcer- sacral, present on admission -WOC following   Labs pending for today  Best practice:  Diet: TF Pain/Anxiety/Delirium protocol (if indicated): fentanyl prn VAP protocol (if indicated): In place DVT prophylaxis: SCDs, subq Heparin GI prophylaxis: PPI Glucose control: SSI Mobility: BR Code Status: Full Family Communication: discussions undertaken by Dr Zada Finders, Palliative Care involved.  Plan to have decision regarding trach vs comfort care 12/21 Disposition: continue ICU  Labs   CBC: Recent Labs  Lab 01/02/19 0356 01/03/19 0447 01/04/19 0500 01/05/19 0513 01/06/19 0602  WBC 16.4* 10.3 9.4 9.0 13.7*  HGB 8.1* 7.2* 8.3* 7.4* 7.9*  HCT 24.6* 21.7* 25.2* 22.7* 23.6*  MCV 98.0 96.4 98.4 97.8 95.5  PLT 197 203 296 281 [id number]    Basic Metabolic Panel: Recent Labs  Lab 01/02/19 0356 01/03/19 0447 01/04/19 0500 01/05/19 0513 01/05/19 1713 01/06/19 0602 01/06/19 1719 01/07/19 0558  NA 140 141 141 136  --  135  --   --   K 3.8 3.5 4.0 3.7  --  4.0  --   --   CL 115*  115* 108 105  --  102  --   --   CO2 18* 23 22 23   --  26  --   --   GLUCOSE 119* 129* 120* 125*  --  122*  --   --   BUN 11 8 5* <5*  --  6*  --   --   CREATININE <0.30* 0.35* <0.30* <0.30*  --  0.38*  --   --   CALCIUM 7.5* 7.2* 7.8* 7.6*  --  7.5*  --   --   MG  --  1.8 1.7 1.7 2.1 2.1 1.8 1.8  PHOS  --  1.4* 3.3 3.0 3.4 3.7 3.6 3.4   GFR: Estimated Creatinine Clearance: 53.2 mL/min (A) (by C-G formula based on SCr of 0.38 mg/dL (L)). Recent Labs  Lab 01/03/19 0447 01/04/19 0500 01/05/19 0513 01/06/19 0602  WBC 10.3 9.4 9.0 13.7*    CBG: Recent Labs  Lab 01/06/19 1533 01/06/19 1944 01/06/19 2313 01/07/19 0321 01/07/19 0740  GLUCAP 118* 143* 95 128* 79      The patient is critically ill with respiratory failure. She requires ICU for high complexity decision making, titration of high alert medications, ventilator management, titration of oxygen and interpretation of advanced monitoring.    I personally spent 35 minutes providing critical care services including personally reviewing test results, discussing care with nursing staff/other  physicians and completing orders pertaining to this patient.  Time was exclusive to the patient and does not include time spent teaching or in procedures.  Voice recognition software was used in the production of this record.  Errors in interpretation may have been inadvertently missed during review.  Francine Graven, MSN, AGACNP  [facility]

## 2019-01-08 LAB — CBC
HCT: 21.1 % — ABNORMAL LOW (ref 36.0–46.0)
Hemoglobin: 7.1 g/dL — ABNORMAL LOW (ref 12.0–15.0)
MCH: 32.3 pg (ref 26.0–34.0)
MCHC: 33.6 g/dL (ref 30.0–36.0)
MCV: 95.9 fL (ref 80.0–100.0)
Platelets: 349 10*3/uL (ref 150–400)
RBC: 2.2 MIL/uL — ABNORMAL LOW (ref 3.87–5.11)
RDW: 13.6 % (ref 11.5–15.5)
WBC: 15.8 10*3/uL — ABNORMAL HIGH (ref 4.0–10.5)
nRBC: 0 % (ref 0.0–0.2)

## 2019-01-08 LAB — BASIC METABOLIC PANEL
Anion gap: 8 (ref 5–15)
BUN: 8 mg/dL (ref 8–23)
CO2: 26 mmol/L (ref 22–32)
Calcium: 7.8 mg/dL — ABNORMAL LOW (ref 8.9–10.3)
Chloride: 99 mmol/L (ref 98–111)
Creatinine, Ser: <0.3 mg/dL — ABNORMAL LOW (ref 0.44–1.00)
Glucose, Bld: 133 mg/dL — ABNORMAL HIGH (ref 70–99)
Potassium: 3.8 mmol/L (ref 3.5–5.1)
Sodium: 133 mmol/L — ABNORMAL LOW (ref 135–145)

## 2019-01-08 LAB — GLUCOSE, CAPILLARY
Glucose-Capillary: 119 mg/dL — ABNORMAL HIGH (ref 70–99)
Glucose-Capillary: 117 mg/dL — ABNORMAL HIGH (ref 70–99)
Glucose-Capillary: 113 mg/dL — ABNORMAL HIGH (ref 70–99)
Glucose-Capillary: 103 mg/dL — ABNORMAL HIGH (ref 70–99)
Glucose-Capillary: 114 mg/dL — ABNORMAL HIGH (ref 70–99)
Glucose-Capillary: 109 mg/dL — ABNORMAL HIGH (ref 70–99)

## 2019-01-08 NOTE — Progress Notes (Signed)
NAME:  Jasmine [name], MRN:  [id number], DOB:  1955/12/04, LOS: 9 ADMISSION DATE:  01/06/2019, CONSULTATION DATE:  01/01/19 REFERRING MD:  Zada Finders, CHIEF COMPLAINT:  Comatose   Brief History   63 year old woman with a history of possible dementia presenting with progressive neurological decline found to have large hemorrhagic right sided intracranial mass status post craniotomy and persistent respiratory failure postop.  History of present illness   63 year old woman with a history of possible dementia presenting with progressive neurological decline found to have large hemorrhagic right sided intracranial mass status post craniotomy and persistent respiratory failure postop.  After discussion with husband regarding poor prognosis, patient was taken to the operating room for debulking, hematoma removal, and EVD placement by Dr. Zada Finders on 12/20/2018.  She remains on the ventilator postop and PCCM consulted to help manage.  Past Medical History  Dementia versus enlarging intracranial mass  Significant Hospital Events   01/13/2019 craniotomy and right intracranial tumor resection  Consults:  PCCM Palliative Care  Procedures:  See significant Hospital events  Significant Diagnostic Tests:  12/18 CT Head  Unchanged position of a left frontal approach ventricular catheter. The lateral ventricles have increased in size since head CT 01/04/2019, most appreciable at the level of the temporal horns.  Otherwise, no significant interval change. Hemorrhagic mass centered within the right thalamus. Associated mass effect upon the midbrain with 10 mm leftward midline shift. Persistent moderate intraventricular hemorrhage.  Micro Data:  12/11 COVID >  Neg 12/11 Blood cultures x 2 > Neg  Antimicrobials:  Ceftriaxone 12/13 >>12/17  Interim history/subjective:  No overnight events.  Low grade temp overnight.  No change in neuro status.   Objective   Blood pressure (!) 115/59,  pulse (!) 104, temperature 99.1 F (37.3 C), resp. rate 14, height 5\' 6"  (1.676 m), weight 46.8 kg, SpO2 100 %.    Vent Mode: PRVC FiO2 (%):  [40 %] 40 % Set Rate:  [10 bmp] 10 bmp Vt Set:  [470 mL] 470 mL PEEP:  [5 cmH20] 5 cmH20 Pressure Support:  [12 cmH20] 12 cmH20 Plateau Pressure:  [18 cmH20-21 cmH20] 19 cmH20   Intake/Output Summary (Last 24 hours) at 01/08/2019 0800 Last data filed at 01/08/2019 0600 Gross per 24 hour  Intake 1470 ml  Output 2235 ml  Net -765 ml   Filed Weights   01/03/19 0453 01/04/19 0500 01/06/19 0500  Weight: 38.4 kg 37.9 kg 46.8 kg   Physical Exam: General: Well-developed, well nourished female, intubated. HENT: Normocephalic. EVD in place draining clear pink tinged CSF.  PERRL-pinpoint.  Right upper gaze.  Moist mucus membranes Neck: No JVD. Trachea midline. CV: RRR. S1S2. No MRG. +2 distal pulses Lungs: BBS present, clear, FNL, symmetrical.  On full support. ABD: +BS x4. SNT/ND. No masses, guarding. ABD is rigid with stimulation but relaxes  GU: Foley EXT:  2+ L foot edema, 1+ R foot edema. B/L upper EXT trace edema  Skin: PWD. In tact. No rashes or lesions Neuro: Withdraws LLE and LUE. Delayed withdrawal RLE and RLE. Rigidity to passive movement of upper EXT. Blinks to threat. Eyes do not track    Resolved Hospital Problem list   Septic shock  Assessment & Plan:  63 year old female with intracranial mass s/p intervention with minimal to no meaningful neurologic recovery off of sedation. She is not a candidate for extubation due to inability to protect her airway. Neurosurgery and Palliative care team have held discussions regarding goals of care with pending decision on  01/09/19. If family wishes to pursue aggressive care (tracheostomy, PEG), she will likely remain dependent for care and will require long term facility placement. Pathology pending although likelihood of salvageability is minimal per neurosurgery.  # Postoperative respiratory  failure  -related to inability to protect airway and likely aspiration # Community-acquired pneumonia s/p antibiotics  P: -Continue ventilator support to prevent eminent deterioration and further organ dysfunction from hypoxemia and hypercarbia.    -Maintain SpO2 greater than or equal to 90%. -Head of bed elevated 30 degrees. -Plateau pressures less than 30 cm H20.  Follow chest x-ray, ABG prn.   -Bronchial hygiene. -RT/bronchodilator protocol. 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  She is hemodynamically stable. Plan Continue to monitor Consider transfusion for hemoglobin less than 7 and/or signs and symptoms of hemodynamic instability  Labs for past 24 hours reviewed in EMR. Best practice:  Diet: TF Pain/Anxiety/Delirium protocol (if indicated): fentanyl prn VAP protocol (if indicated): In place DVT prophylaxis: SCDs, subq Heparin GI prophylaxis: PPI Glucose control: SSI Mobility: BR Code Status: Full Family Communication: discussions undertaken by Dr Zada Finders, Palliative Care involved.  Plan to have decision regarding trach vs comfort care 12/21 Disposition: continue ICU  Labs   CBC: Recent Labs  Lab 01/04/19 0500 01/05/19 0513 01/06/19 0602 01/07/19 0931 01/08/19 0447  WBC 9.4 9.0 13.7* 15.9* 15.8*  HGB 8.3* 7.4* 7.9*  7.5* 7.1*  HCT 25.2* 22.7* 23.6* 22.4* 21.1*  MCV 98.4 97.8 95.5 94.5 95.9  PLT 296 281 316 323 0000000    Basic Metabolic Panel: Recent Labs  Lab 01/04/19 0500 01/05/19 0513 01/05/19 1713 01/06/19 0602 01/06/19 1719 01/07/19 0558 01/07/19 0931 01/08/19 0447  NA 141 136  --  135  --   --  132* 133*  K 4.0 3.7  --  4.0  --   --  4.0 3.8  CL 108 105  --  102  --   --  100 99  CO2 22 23  --  26  --   --  25 26  GLUCOSE 120* 125*  --  122*  --   --  118* 133*  BUN 5* <5*  --  6*  --   --  8 8  CREATININE <0.30* <0.30*  --  0.38*  --   --  <0.30* <0.30*  CALCIUM 7.8* 7.6*  --  7.5*  --   --  7.8* 7.8*  MG 1.7 1.7 2.1 2.1 1.8 1.8  --   --   PHOS 3.3 3.0 3.4 3.7 3.6 3.4  --   --    GFR: CrCl cannot be calculated (This lab value cannot be used to calculate CrCl because it is not a number: <0.30). Recent Labs  Lab 01/05/19 0513 01/06/19 0602 01/07/19 0931 01/08/19 0447  WBC 9.0 13.7* 15.9* 15.8*    CBG: Recent Labs  Lab 01/07/19 1153 01/07/19 1623 01/07/19 1931 01/07/19 2310 01/08/19 0314  GLUCAP 120* 110* 119*  121* 119*    The patient is critically ill with respiratory failure. She requires ICU for high complexity decision making, titration of high alert medications, ventilator management, titration of oxygen and interpretation of advanced monitoring.    I personally spent 35 minutes providing critical care services including personally reviewing test results, discussing care with nursing staff/other physicians and completing orders pertaining to this patient.  Time was exclusive to the patient and does not include time spent teaching or in procedures.  Voice recognition software was used in the production of this record.  Errors in interpretation may have been inadvertently missed during review.  Francine Graven, MSN, AGACNP  [facility]

## 2019-01-08 NOTE — Progress Notes (Signed)
  NEUROSURGERY PROGRESS NOTE   Pt seen and examined. No issues overnight.   EXAM: Temp:  [97.9 F (36.6 C)-100.6 F (38.1 C)] 99.9 F (37.7 C) (12/20 1000) Pulse Rate:  [93-112] 101 (12/20 1058) Resp:  [13-27] 13 (12/20 1058) BP: (90-125)/(57-85) 112/80 (12/20 1058) SpO2:  [100 %] 100 % (12/20 1058) Arterial Line BP: (97-137)/(55-72) 115/63 (12/20 0600) FiO2 (%):  [40 %] 40 % (12/20 1058) Intake/Output      12/19 0701 - 12/20 0700 12/20 0701 - 12/21 0700   Other 120    NG/GT 1405    Total Intake(mL/kg) 1525 (32.6)    Urine (mL/kg/hr) 2235 (2) 60 (0.3)   Drains     Stool 0    Total Output 2235 60   Net -710 -60        Stool Occurrence 3 x     Eyes open spontaneously Breathing over vent Not following commands Moves RUE purposefully, w/d RLE No significant movement LUE/LLE EVD in place, clamped  LABS: Lab Results  Component Value Date   CREATININE <0.30 (L) 01/08/2019   BUN 8 01/08/2019   NA 133 (L) 01/08/2019   K 3.8 01/08/2019   CL 99 01/08/2019   CO2 26 01/08/2019   Lab Results  Component Value Date   WBC 15.8 (H) 01/08/2019   HGB 7.1 (L) 01/08/2019   HCT 21.1 (L) 01/08/2019   MCV 95.9 01/08/2019   PLT 349 01/08/2019     IMPRESSION: - 63 y.o. female s/p debulking of large thalamic tumor, remains neurologically unchanged.   PLAN: - Cont supportive care, family to decide about goals of care tomorrow given poor prognosis for any meaningful recovery - Keep EVD clamped for now

## 2019-01-09 ENCOUNTER — Other Ambulatory Visit: Payer: Self-pay

## 2019-01-09 LAB — BASIC METABOLIC PANEL
Anion gap: 9 (ref 5–15)
BUN: 10 mg/dL (ref 8–23)
CO2: 24 mmol/L (ref 22–32)
Calcium: 8 mg/dL — ABNORMAL LOW (ref 8.9–10.3)
Chloride: 101 mmol/L (ref 98–111)
Creatinine, Ser: 0.37 mg/dL — ABNORMAL LOW (ref 0.44–1.00)
GFR calc Af Amer: >60 mL/min (ref >60)
GFR calc non Af Amer: >60 mL/min (ref >60)
Glucose, Bld: 110 mg/dL — ABNORMAL HIGH (ref 70–99)
Potassium: 4.2 mmol/L (ref 3.5–5.1)
Sodium: 134 mmol/L — ABNORMAL LOW (ref 135–145)

## 2019-01-09 LAB — CBC
HCT: 22.3 % — ABNORMAL LOW (ref 36.0–46.0)
Hemoglobin: 7.2 g/dL — ABNORMAL LOW (ref 12.0–15.0)
MCH: 30.9 pg (ref 26.0–34.0)
MCHC: 32.3 g/dL (ref 30.0–36.0)
MCV: 95.7 fL (ref 80.0–100.0)
Platelets: 276 10*3/uL (ref 150–400)
RBC: 2.33 MIL/uL — ABNORMAL LOW (ref 3.87–5.11)
RDW: 13.8 % (ref 11.5–15.5)
WBC: 15.4 10*3/uL — ABNORMAL HIGH (ref 4.0–10.5)
nRBC: 0 % (ref 0.0–0.2)

## 2019-01-09 LAB — GLUCOSE, CAPILLARY
Glucose-Capillary: 128 mg/dL — ABNORMAL HIGH (ref 70–99)
Glucose-Capillary: 100 mg/dL — ABNORMAL HIGH (ref 70–99)
Glucose-Capillary: 99 mg/dL (ref 70–99)
Glucose-Capillary: 144 mg/dL — ABNORMAL HIGH (ref 70–99)
Glucose-Capillary: 112 mg/dL — ABNORMAL HIGH (ref 70–99)
Glucose-Capillary: 127 mg/dL — ABNORMAL HIGH (ref 70–99)

## 2019-01-09 MED ORDER — CHLORHEXIDINE GLUCONATE 0.12 % MT SOLN
OROMUCOSAL | Status: AC
  Filled 2019-01-09: qty 15

## 2019-01-09 MED ORDER — JUVEN PO PACK
1.0000 | PACK | Freq: Two times a day (BID) | ORAL | Status: DC
  Administered 2019-01-09 – 2019-01-12 (×6): 1
  Filled 2019-01-09 (×7): qty 1

## 2019-01-09 NOTE — Progress Notes (Signed)
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  Ongoing.   GOAL:   Patient will meet greater than or equal to 90% of their needs  Progressing.   MONITOR:   Vent status, TF tolerance, Skin  REASON FOR ASSESSMENT:   Ventilator    ASSESSMENT:   Pt with PMH of depression, anxiety, progressive dementia now severe (dx 2017), and epilepsy and admitted with acute obtunded state at home. Per husband pt was bedbound, not using her left side, and had poor oral intake.  Pt discussed during ICU rounds and with RN.   CT shows large R hemorrhagic hemispheric mass invading thalamus with associated hemorrhage and hydrocephalus.  12/12 s/p craniotomy for R tumor debulking and hematoma evacuation.  12/13 TF: Vital High Protein @ 40 ml/hr and 30 ml Prostat BID via OG tube  Being treated for LLL PNA Palliative care following, palliative meeting planned for today.  Noted pt with stage III on her buttocks which was present on admission.   Patient is currently intubated on ventilator support MV: 6.2 L/min Temp (24hrs), Avg:99.8 F (37.7 C), Min:98.4 F (36.9 C), Max:101.1 F (38.4 C)  Medications reviewed and include:  Labs reviewed EVD: 61 ml    Diet Order:   Diet Order            Diet NPO time specified  Diet effective now              EDUCATION NEEDS:   Not appropriate for education at this time  Skin:  Skin Assessment: Skin Integrity Issues: Skin Integrity Issues:: Stage III, DTI DTI: hip Stage III: buttocks  Last BM:   12/20  Height:   Ht Readings from Last 1 Encounters:  12/25/2018 5\' 6"  (1.676 m)    Weight:   Wt Readings from Last 1 Encounters:  01/06/19 46.8 kg    Ideal Body Weight:  59 kg  BMI:  Body mass index is 16.65 kg/m.  Estimated Nutritional Needs:   Kcal:  1400-1600 kcals  Protein:  70-80  Fluid:  >/= 1.4 L  Maylon Peppers RD, LDN, CNSC [phone] Pager [phone] After Hours Pager

## 2019-01-09 NOTE — Progress Notes (Signed)
NAME:  Jasmine [name], MRN:  [id number], DOB:  [birth year], LOS: 66 ADMISSION DATE:  01/15/2019, CONSULTATION DATE:  01/01/19 REFERRING MD:  Zada Finders, CHIEF COMPLAINT:  Comatose   Brief History   63 year old woman with a history of possible dementia presenting with progressive neurological decline found to have large hemorrhagic right sided intracranial mass status post craniotomy and persistent respiratory failure postop.  History of present illness   63 year old woman with a history of possible dementia presenting with progressive neurological decline found to have large hemorrhagic right sided intracranial mass status post craniotomy and persistent respiratory failure postop.  After discussion with husband regarding poor prognosis, patient was taken to the operating room for debulking, hematoma removal, and EVD placement by Dr. Zada Finders on 01/10/2019.  She remains on the ventilator postop and PCCM consulted to help manage.  Past Medical History  Dementia versus enlarging intracranial mass  Significant Hospital Events   01/15/2019 craniotomy and right intracranial tumor resection  Consults:  PCCM Palliative Care  Procedures:  See significant Hospital events  Significant Diagnostic Tests:  12/18 CT Head  > lateral ventricles have increased in size since prior.  Otherwise no sig change.  Hemorrhagic mass centered within the right thalamus with associated ME upon the midbrain with 66mm leftward MLS.  Persistent IV hemorrhage.  Micro Data:  12/11 COVID >  Neg 12/11 Blood cultures x 2 > Neg  Antimicrobials:  Ceftriaxone 12/13 >>12/17  Interim history/subjective:  No acute events.  No changes.  Objective   Blood pressure 114/74, pulse (!) 108, temperature 99 F (37.2 C), resp. rate (!) 21, height 5\' 6"  (1.676 m), weight 46.8 kg, SpO2 100 %.    Vent Mode: PRVC FiO2 (%):  [40 %] 40 % Set Rate:  [10 bmp] 10 bmp Vt Set:  [470 mL] 470 mL PEEP:  [5 cmH20] 5 cmH20 Plateau  Pressure:  [20 cmH20-23 cmH20] 23 cmH20   Intake/Output Summary (Last 24 hours) at 01/09/2019 0728 Last data filed at 01/09/2019 0600 Gross per 24 hour  Intake 1495 ml  Output 720 ml  Net 775 ml   Filed Weights   01/03/19 0453 01/04/19 0500 01/06/19 0500  Weight: 38.4 kg 37.9 kg 46.8 kg   Physical Exam: General: Adult female, frail, resting in bed, in NAD. Neuro: Not responsive despite no sedation.  Does have some purposeful movement on RUE however. HEENT: [place]/AT. Sclerae anicteric. Disconjugate gaze. Cardiovascular: RRR, no M/R/G.  Lungs: Respirations even and unlabored.  CTA bilaterally, No W/R/R. Abdomen: BS x 4, soft, NT/ND.  Musculoskeletal: No gross deformities, no edema.  Skin: Intact, warm, no rashes.  Assessment & Plan:  63 year old female with intracranial mass s/p intervention with minimal to no meaningful neurologic recovery off of sedation. She is not a candidate for extubation due to inability to protect her airway. Neurosurgery and Palliative care team have held discussions regarding goals of care with pending decision on 01/09/19. If family wishes to pursue aggressive care (tracheostomy, PEG), she will likely remain dependent for care and will require long term facility placement. Pathology pending although likelihood of salvageability is minimal per neurosurgery.  # Postoperative respiratory failure  -related to inability to protect airway and likely aspiration # Community-acquired pneumonia s/p antibiotics  P: -Continue full vent support. - No weaning due to mental status. - Bronchial hygiene. - Family discussion pending regarding tracheostomy today 12/21.  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 Plan - Continue to monitor - Consider transfusion for hemoglobin less than 7 and/or signs and symptoms of hemodynamic instability  Best practice:  Diet: TF Pain/Anxiety/Delirium protocol (if indicated): fentanyl prn VAP protocol (if indicated): In place DVT prophylaxis: SCDs, subq Heparin GI prophylaxis: PPI Glucose control: SSI Mobility: BR Code Status: Full Family Communication: discussions undertaken by Dr Zada Finders, Palliative Care involved.  Plan to have decision regarding trach vs comfort care today 12/21 Disposition: continue ICU   CC time: 30 min.   Montey Hora, [facility] 01/09/2019, 7:36 AM

## 2019-01-09 NOTE — Progress Notes (Addendum)
Neurosurgery Service Progress Note  Subjective: No acute events overnight   Objective: Vitals:   01/09/19 0500 01/09/19 0600 01/09/19 0700 01/09/19 0722  BP: (!) 81/57 95/63 114/74 114/74  Pulse: 84 91 99 (!) 108  Resp: _0 (!) 21  Temp: 98.4 F (36.9 C) 98.4 F (36.9 C) 99 F (37.2 C)   TempSrc:      SpO2: 100% 100% 100% 100%  Weight:      Height:       Temp (24hrs), Avg:99.7 F (37.6 C), Min:98.4 F (36.9 C), Max:100.2 F (37.9 C)  CBC Latest Ref Rng & Units 01/08/2019 01/07/2019 01/06/2019  WBC 4.0 - 10.5 K/uL 15.8(H) 15.9(H) 13.7(H)  Hemoglobin 12.0 - 15.0 g/dL 7.1(L) 7.5(L) 7.9(L)  Hematocrit 36.0 - 46.0 % 21.1(L) 22.4(L) 23.6(L)  Platelets 150 - 400 K/uL 349 323 316   BMP Latest Ref Rng & Units 01/08/2019 01/07/2019 01/06/2019  Glucose 70 - 99 mg/dL 133(H) 118(H) 122(H)  BUN 8 - 23 mg/dL 8 8 6(L)  Creatinine 0.44 - 1.00 mg/dL <0.30(L) <0.30(L) 0.38(L)  BUN/Creat Ratio 12 - 28 - - -  Sodium 135 - 145 mmol/L 133(L) 132(L) 135  Potassium 3.5 - 5.1 mmol/L 3.8 4.0 4.0  Chloride 98 - 111 mmol/L 99 100 102  CO2 22 - 32 mmol/L _1 Calcium 8.9 - 10.3 mg/dL 7.8(L) 7.8(L) 7.5(L)    Intake/Output Summary (Last 24 hours) at 01/09/2019 0745 Last data filed at 01/09/2019 0600 Gross per 24 hour  Intake 1495 ml  Output 720 ml  Net 775 ml    Current Facility-Administered Medications:  .  acetaminophen (TYLENOL) 160 MG/5ML solution 650 mg, 650 mg, Per Tube, Q4H PRN, Judith Part, MD, 650 mg at 01/09/19 0011 .  acetaminophen (TYLENOL) tablet 650 mg, 650 mg, Oral, Q4H PRN **OR** acetaminophen (TYLENOL) suppository 650 mg, 650 mg, Rectal, Q4H PRN, Virtie Bungert A, MD .  chlorhexidine (PERIDEX) 0.12 % solution, , , ,  .  chlorhexidine gluconate (MEDLINE KIT) (PERIDEX) 0.12 % solution 15 mL, 15 mL, Mouth Rinse, BID, Wells Mabe, Joyice Faster, MD, 15 mL at 01/09/19 0743 .  Chlorhexidine Gluconate Cloth 2 % PADS 6 each, 6 each, Topical, Daily, Judith Part,  MD, 6 each at 01/06/19 0400 .  feeding supplement (OSMOLITE 1.2 CAL) liquid 1,000 mL, 1,000 mL, Per Tube, Continuous, Collene Gobble, MD, Last Rate: 55 mL/hr at 01/08/19 1530, 1,000 mL at 01/08/19 1530 .  fentaNYL (SUBLIMAZE) injection 50 mcg, 50 mcg, Intravenous, Q15 min PRN, Stretch, Marily Lente, MD, 50 mcg at 01/01/19 0244 .  fentaNYL (SUBLIMAZE) injection 50-200 mcg, 50-200 mcg, Intravenous, Q30 min PRN, Stretch, Marily Lente, MD, 100 mcg at 01/04/19 1845 .  heparin injection 5,000 Units, 5,000 Units, Subcutaneous, Q8H, Judith Part, MD, 5,000 Units at 01/09/19 [phone] .  insulin aspart (novoLOG) injection 0-24 Units, 0-24 Units, Subcutaneous, Q4H, Candee Furbish, MD, 2 Units at 01/09/19 [phone] .  labetalol (NORMODYNE) injection 10-40 mg, 10-40 mg, Intravenous, Q10 min PRN, Judith Part, MD, 10 mg at 01/04/19 0130 .  MEDLINE mouth rinse, 15 mL, Mouth Rinse, 10 times per day, Judith Part, MD, 15 mL at 01/09/19 0607 .  multivitamin with minerals tablet 1 tablet, 1 tablet, Per Tube, Daily, Collene Gobble, MD, 1 tablet at 01/08/19 [phone] .  ondansetron (ZOFRAN) tablet 4 mg, 4 mg, Oral, Q4H PRN **OR** ondansetron (ZOFRAN) injection 4 mg, 4 mg, Intravenous, Q4H PRN, Judith Part, MD .  pantoprazole sodium (PROTONIX) 40 mg/20 mL oral suspension 40 mg, 40 mg, Per Tube, Daily, Alfonzo Feller, NP, 40 mg at 01/08/19 0903 .  polyethylene glycol (MIRALAX / GLYCOLAX) packet 17 g, 17 g, Oral, Daily PRN, Graylyn Bunney A, MD .  potassium & sodium phosphates (PHOS-NAK) 280-160-250 MG packet 1 packet, 1 packet, Per Tube, TID WC & HS, Paulita Fujita B, NP, 1 packet at 01/08/19 2108   Physical Exam: Intubated, left greater than eye opening to voice, not FC, pupils 4-->3, +R 4th nerve palsy, pupil neutral position on left, not FC, withdrawing on left, R w/ increased tone Incision / drain site c/d/i  Assessment & Plan: 63 y.o. 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 12/12 post-op MRI w/ expected residual tumor in brainstem, good debulking, scattered areas of diffusion changes including posterior pons likely 2/2 hemorrhage event, unclear pattern. 12/14 EEG w/ some focal spikes, no epileptiform activity, CT CAP with consolidation, no primary tumor, 12/15 rpt CTH w/ improved shift / pneumocephalus, ventriculomegaly, 12/16 rpt CTH slightly increased R subdural collection, EVD raised to +15  -CCM recs -EVD clamped with stable exam, but on rounds this morning I transduced an ICP of 20cm H2O, will re-open to +15 above head level, will try another clamp trial this week -path still pending -SCDs/TEDs, SQH -will talk to family today about goals of care, trach & PEG versus withdrawal of care. Discussed w/ pathology, unclear diagnosis, most of the sample was hematoma that I removed, may be difficult to get a diagnosis, send outs / stains are pending.   Pt's husband coming by this afternoon, will meet him at bedside to discuss goals of care. Unless we can wean her EVD, she would require T/P/VPS and be high risk for shunt malfunction.  Joyice Faster Eliyana Pagliaro  01/09/19 7:45 AM

## 2019-01-09 NOTE — Progress Notes (Signed)
Husband has not visited bedside today as originally planned. Daughter coming from [facility] this evening. Hopefully [place] discussion with husband and daughter tomorrow. PMT provider will follow. Discussed with RN.   NO CHARGE  Ihor Dow, [place], FNP-C Palliative Medicine Team  Phone: [phone] Fax: [phone]

## 2019-01-09 NOTE — Progress Notes (Signed)
PMT provider to follow-up with husband this afternoon when he is off work. Likely around 3:30-4pm. Discussed with RN. Thank you.   NO CHARGE  Ihor Dow, [place], FNP-C Palliative Medicine Team  Phone: [phone] Fax: [phone]

## 2019-01-09 NOTE — Progress Notes (Signed)
I was present with pt for spiritual support. No family present. I offered spiritual support with ministry of presence and silent prayer. Miss Jasmine [name] was not able to communicate, so I shared some words of comfort and silent presence. Chaplain will follow up as needed.  Palliative care  Chaplain resident Fidel Levy MA [phone]

## 2019-01-10 ENCOUNTER — Inpatient Hospital Stay (HOSPITAL_COMMUNITY): Payer: BLUE CROSS/BLUE SHIELD

## 2019-01-10 DIAGNOSIS — G9340 Encephalopathy, unspecified: Secondary | ICD-10-CM

## 2019-01-10 LAB — GLUCOSE, CAPILLARY
Glucose-Capillary: 96 mg/dL (ref 70–99)
Glucose-Capillary: 116 mg/dL — ABNORMAL HIGH (ref 70–99)
Glucose-Capillary: 107 mg/dL — ABNORMAL HIGH (ref 70–99)
Glucose-Capillary: 122 mg/dL — ABNORMAL HIGH (ref 70–99)
Glucose-Capillary: 111 mg/dL — ABNORMAL HIGH (ref 70–99)
Glucose-Capillary: 133 mg/dL — ABNORMAL HIGH (ref 70–99)

## 2019-01-10 LAB — BASIC METABOLIC PANEL
Anion gap: 12 (ref 5–15)
BUN: 12 mg/dL (ref 8–23)
CO2: 25 mmol/L (ref 22–32)
Calcium: 8.3 mg/dL — ABNORMAL LOW (ref 8.9–10.3)
Chloride: 98 mmol/L (ref 98–111)
Creatinine, Ser: 0.35 mg/dL — ABNORMAL LOW (ref 0.44–1.00)
GFR calc Af Amer: >60 mL/min (ref >60)
GFR calc non Af Amer: >60 mL/min (ref >60)
Glucose, Bld: 124 mg/dL — ABNORMAL HIGH (ref 70–99)
Potassium: 4.3 mmol/L (ref 3.5–5.1)
Sodium: 135 mmol/L (ref 135–145)

## 2019-01-10 LAB — CBC
HCT: 21.6 % — ABNORMAL LOW (ref 36.0–46.0)
Hemoglobin: 7.1 g/dL — ABNORMAL LOW (ref 12.0–15.0)
MCH: 31.6 pg (ref 26.0–34.0)
MCHC: 32.9 g/dL (ref 30.0–36.0)
MCV: 96 fL (ref 80.0–100.0)
Platelets: 502 10*3/uL — ABNORMAL HIGH (ref 150–400)
RBC: 2.25 MIL/uL — ABNORMAL LOW (ref 3.87–5.11)
RDW: 13.8 % (ref 11.5–15.5)
WBC: 14.4 10*3/uL — ABNORMAL HIGH (ref 4.0–10.5)
nRBC: 0 % (ref 0.0–0.2)

## 2019-01-10 LAB — PHOSPHORUS: Phosphorus: 3.9 mg/dL (ref 2.5–4.6)

## 2019-01-10 LAB — MAGNESIUM: Magnesium: 1.8 mg/dL (ref 1.7–2.4)

## 2019-01-10 NOTE — Progress Notes (Signed)
Daily Progress Note   Patient Name: Jasmine [name]       Date: 01/10/2019 DOB: 24-Sep-1955  Age: 63 y.o. MRN#: [id number] Attending Physician: Judith Part, MD Primary Care Physician: Chevis Pretty, FNP Admit Date: 01/16/2019  Reason for Consultation/Follow-up: Establishing goals of care  Subjective: Intubated, off sedation. Intermittently will open eyes to voice. Squeezes right hand on command.   GOC:  F/u with patient's husband and daughter Jasmine [name] and [name], interventions, plan of care, [name]   Length of Stay: 11  Current Medications: Scheduled Meds:  . chlorhexidine gluconate (MEDLINE KIT)  15 mL Mouth Rinse BID  . Chlorhexidine Gluconate Cloth  6 each Topical Daily  . heparin injection (subcutaneous)  5,000 Units Subcutaneous Q8H  . insulin aspart  0-24 Units Subcutaneous Q4H  . mouth rinse  15 mL Mouth Rinse 10 times per day  . multivitamin with minerals  1 tablet Per Tube Daily  . nutrition supplement (JUVEN)  1 packet Per Tube BID BM  . pantoprazole sodium  40 mg Per Tube Daily  . potassium & sodium phosphates  1 packet Per Tube TID WC & HS     Continuous Infusions: . feeding supplement (OSMOLITE 1.2 CAL) 1,000 mL (01/10/19 0755)    PRN Meds: acetaminophen (TYLENOL) oral liquid 160 mg/5 mL, [DISCONTINUED] acetaminophen **OR** acetaminophen, fentaNYL (SUBLIMAZE) injection, labetalol, ondansetron **OR** ondansetron (ZOFRAN) IV, polyethylene glycol  Physical Exam Vitals and nursing note reviewed.  Constitutional:      Appearance: She is ill-appearing.     Interventions: She is intubated.  Cardiovascular:     Rate and Rhythm: Tachycardia present.  Pulmonary:     Effort: No tachypnea, accessory muscle usage or respiratory distress. She is intubated.     Breath sounds: Normal breath sounds.     Comments: Full vent support  Abdominal:     Tenderness: There is no abdominal tenderness.  Skin:    General: Skin is warm and dry.  Neurological:     Comments: Intermittently will open eyes to voice. Left hemiplegia. Squeezes right hand on command            Vital Signs: BP 119/79   Pulse (!) 113   Temp (!) 100.6 F (38.1 C)   Resp 20   Ht 5' 6"  (1.676 m)   Wt 46.8 kg   SpO2 100%   BMI 16.65 kg/m  SpO2: SpO2: 100 % O2 Device: O2 Device: Ventilator O2 Flow Rate:    Intake/output summary:   Intake/Output Summary (Last 24 hours) at 01/10/2019 1516 Last data filed at 01/10/2019 1500 Gross per 24 hour  Intake 1265 ml  Output 3757 ml  Net -2492 ml   LBM: Last BM Date: 01/08/19 Baseline Weight: Weight: 41.8 kg Most recent weight: Weight: 46.8 kg       Palliative Assessment/Data: PPS 30%      Patient Active Problem List   Diagnosis Date Noted  . Protein-calorie malnutrition, severe 01/03/2019  . Palliative care by specialist   . Goals of care, counseling/discussion   . Respiratory failure ([place])   . Pressure injury of skin 01/12/2019  . ICH (intracerebral hemorrhage) ([place]) 12/29/2018  . Dementia ([place]) 05/03/2017  . Dermatitis 08/05/2015  . Chronic back pain 12/06/2013    Palliative Care Assessment & Plan    Patient Profile: 63 y.o. female  with past medical history of depression, anxiety, progressive dementia, and epilepsy admitted on 01/14/2019 with acute obtunded state at home. CT with large right hemorrhagic hemispheric mass invading thalamus with associated hemorrhage and hydrocephalus. 12/12 left EVD placed and s/p craniotomy for right tumor debulking and hematoma evacuation. Patient remains on ventilator post-op and PCCM following. Abnormal CT chest consistent with left lower lobe pneumonia, receiving ceftriaxone. EEG without evidence of active seizures, consistent with encephalopathy. Prognosis poor with current exam and MRI findings. Palliative medicine consultation for goals of care.   Assessment: Hemorrhagic intracranial mass s/p debulking Postoperative respiratory failure requiring mechanical ventilation Left lower lobe pneumonia Progressive dementia Severe protein calorie malnutrition  Recommendations/Plan:  Continue current plan of care.  F/u GOC discussion with husband and daughter. Pending decision regarding continued aggressive medical management vs. Comfort care. Family plans to make this decision by tomorrow. PMT provider will f/u 12/23.   Code Status: FULL   Code Status Orders  (From admission, onward)         Start     Ordered   01/10/2019 0345  Full code  Continuous     01/02/2019 0344        Code Status History    This patient has a current code status but no historical code status.   Advance Care Planning Activity       Prognosis:   Poor prognosis  Discharge Planning:  To Be Determined  Care plan was discussed with RN, Dr Zada Finders, husband, daughter  Thank you for allowing the Palliative Medicine Team to assist in the care of this patient.   Time In: 1520- Time Out: 1600 Total Time 40 Prolonged Time Billed no      Greater than 50%  of this time was spent counseling and coordinating care related to the above assessment and plan.  Ihor Dow, DNP, FNP-C Palliative Medicine Team  Phone: [phone] Fax: [phone]  Please contact Palliative Medicine Team phone at [phone] for questions and concerns.

## 2019-01-10 NOTE — Progress Notes (Addendum)
Neurosurgery Service Progress Note  Subjective: No acute events overnight   Objective: Vitals:   01/10/19 0500 01/10/19 0600 01/10/19 0700 01/10/19 0720  BP: 111/82 (!) 127/105 114/73   Pulse: (!) 102 (!) 104 (!) 101   Resp: 18 20 19    Temp: 99.3 F (37.4 C) 99.5 F (37.5 C) 100 F (37.8 C)   TempSrc:      SpO2: 100% 100% 100% 100%  Weight:      Height:       Temp (24hrs), Avg:100.4 F (38 C), Min:99.3 F (37.4 C), Max:101.1 F (38.4 C)  CBC Latest Ref Rng & Units 01/10/2019 01/09/2019 01/08/2019  WBC 4.0 - 10.5 K/uL 14.4(H) 15.4(H) 15.8(H)  Hemoglobin 12.0 - 15.0 g/dL 7.1(L) 7.2(L) 7.1(L)  Hematocrit 36.0 - 46.0 % 21.6(L) 22.3(L) 21.1(L)  Platelets 150 - 400 K/uL 502(H) 276 349   BMP Latest Ref Rng & Units 01/09/2019 01/08/2019 01/07/2019  Glucose 70 - 99 mg/dL 110(H) 133(H) 118(H)  BUN 8 - 23 mg/dL 10 8 8   Creatinine 0.44 - 1.00 mg/dL 0.37(L) <0.30(L) <0.30(L)  BUN/Creat Ratio 12 - 28 - - -  Sodium 135 - 145 mmol/L 134(L) 133(L) 132(L)  Potassium 3.5 - 5.1 mmol/L 4.2 3.8 4.0  Chloride 98 - 111 mmol/L 101 99 100  CO2 22 - 32 mmol/L 24 26 25   Calcium 8.9 - 10.3 mg/dL 8.0(L) 7.8(L) 7.8(L)    Intake/Output Summary (Last 24 hours) at 01/10/2019 0736 Last data filed at 01/10/2019 0700 Gross per 24 hour  Intake 1375 ml  Output 2826 ml  Net -1451 ml    Current Facility-Administered Medications:  .  acetaminophen (TYLENOL) 160 MG/5ML solution 650 mg, 650 mg, Per Tube, Q4H PRN, Judith Part, MD, 650 mg at 01/10/19 0109 .  [DISCONTINUED] acetaminophen (TYLENOL) tablet 650 mg, 650 mg, Oral, Q4H PRN **OR** acetaminophen (TYLENOL) suppository 650 mg, 650 mg, Rectal, Q4H PRN, Lavere Stork A, MD .  chlorhexidine gluconate (MEDLINE KIT) (PERIDEX) 0.12 % solution 15 mL, 15 mL, Mouth Rinse, BID, Geral Coker, Joyice Faster, MD, 15 mL at 01/09/19 2000 .  Chlorhexidine Gluconate Cloth 2 % PADS 6 each, 6 each, Topical, Daily, Judith Part, MD, 6 each at 01/10/19 0200 .   feeding supplement (OSMOLITE 1.2 CAL) liquid 1,000 mL, 1,000 mL, Per Tube, Continuous, Collene Gobble, MD, Last Rate: 55 mL/hr at 01/09/19 1223, 1,000 mL at 01/09/19 1223 .  fentaNYL (SUBLIMAZE) injection 50-200 mcg, 50-200 mcg, Intravenous, Q30 min PRN, Stretch, Marily Lente, MD, 100 mcg at 01/04/19 1845 .  heparin injection 5,000 Units, 5,000 Units, Subcutaneous, Q8H, Lether Tesch, Joyice Faster, MD, 5,000 Units at 01/10/19 0630 .  insulin aspart (novoLOG) injection 0-24 Units, 0-24 Units, Subcutaneous, Q4H, Candee Furbish, MD, 2 Units at 01/10/19 0109 .  labetalol (NORMODYNE) injection 10-40 mg, 10-40 mg, Intravenous, Q10 min PRN, Judith Part, MD, 10 mg at 01/04/19 0130 .  MEDLINE mouth rinse, 15 mL, Mouth Rinse, 10 times per day, Judith Part, MD, 15 mL at 01/10/19 0600 .  multivitamin with minerals tablet 1 tablet, 1 tablet, Per Tube, Daily, Collene Gobble, MD, 1 tablet at 01/09/19 0935 .  nutrition supplement (JUVEN) (JUVEN) powder packet 1 packet, 1 packet, Per Tube, BID BM, Judith Part, MD, 1 packet at 01/09/19 1734 .  ondansetron (ZOFRAN) tablet 4 mg, 4 mg, Oral, Q4H PRN **OR** ondansetron (ZOFRAN) injection 4 mg, 4 mg, Intravenous, Q4H PRN, Jamaiyah Pyle A, MD .  pantoprazole sodium (PROTONIX) 40 mg/20 mL  oral suspension 40 mg, 40 mg, Per Tube, Daily, Alfonzo Feller, NP, 40 mg at 01/09/19 0935 .  polyethylene glycol (MIRALAX / GLYCOLAX) packet 17 g, 17 g, Oral, Daily PRN, Librada Castronovo A, MD .  potassium & sodium phosphates (PHOS-NAK) 280-160-250 MG packet 1 packet, 1 packet, Per Tube, TID WC & HS, Paulita Fujita B, NP, 1 packet at 01/09/19 2222   Physical Exam: Intubated, left greater than eye opening to voice, +R 4th nerve palsy, pupil neutral position on left, not FC some non-purposefuly movement in the RUE w/ increased tone, LUE minimal withdrawal, BLE min withdrawal Incision / drain site c/d/i  Assessment & Plan: 63 y.o. 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 12/12 post-op MRI w/ expected residual tumor in brainstem, good debulking, scattered areas of diffusion changes including posterior pons likely 2/2 hemorrhage event, unclear pattern. 12/14 EEG w/ some focal spikes, no epileptiform activity, CT CAP with consolidation, no primary tumor, 12/15 rpt CTH w/ improved shift / pneumocephalus, ventriculomegaly, 12/16 rpt CTH slightly increased R subdural collection, EVD raised to +15  -CCM recs -raise EVD to +20 -path still pending, trying to get a prelim but the sample is diagnostically difficult given the hematoma -SCDs/TEDs, SQH -family didn't show up yesterday for goals of care. Will talk to family today about goals of care, trach & PEG & possibly shunt versus withdrawal of care or possibly one way extubation  Judith Part  01/10/19 7:36 AM

## 2019-01-10 NOTE — Progress Notes (Signed)
NAME:  Jasmine [name], MRN:  [id number], DOB:  17-Aug-1955, LOS: 27 ADMISSION DATE:  12/25/2018, CONSULTATION DATE:  01/01/19 REFERRING MD:  Zada Finders, CHIEF COMPLAINT:  Comatose   Brief History   63 year old woman with a history of possible dementia presenting with progressive neurological decline found to have large hemorrhagic right sided intracranial mass status post craniotomy and persistent respiratory failure postop.  History of present illness   63 year old woman with a history of possible dementia presenting with progressive neurological decline found to have large hemorrhagic right sided intracranial mass status post craniotomy and persistent respiratory failure postop.  After discussion with husband regarding poor prognosis, patient was taken to the operating room for debulking, hematoma removal, and EVD placement by Dr. Zada Finders on 01/08/2019.  She remains on the ventilator postop and PCCM consulted to help manage.  Past Medical History  Dementia versus enlarging intracranial mass  Significant Hospital Events   01/06/2019 craniotomy and right intracranial tumor resection 12/22 > PMT hoping to meet with family  Consults:  PCCM Palliative Care  Procedures:  See significant Hospital events  Significant Diagnostic Tests:  12/18 CT Head  > lateral ventricles have increased in size since prior.  Otherwise no sig change.  Hemorrhagic mass centered within the right thalamus with associated ME upon the midbrain with 55mm leftward MLS.  Persistent IV hemorrhage.  Micro Data:  12/11 COVID >  Neg 12/11 Blood cultures x 2 > Neg  Antimicrobials:  Ceftriaxone 12/13 >>12/17  Interim history/subjective:  Family no show 12/21.  PMT hoping to meet with them today 12/22.  Objective   Blood pressure 107/65, pulse 99, temperature 100.2 F (37.9 C), resp. rate (!) 21, height 5\' 6"  (1.676 m), weight 46.8 kg, SpO2 100 %.    Vent Mode: PSV;CPAP FiO2 (%):  [40 %] 40 % Set Rate:  [10  bmp] 10 bmp Vt Set:  [470 mL] 470 mL PEEP:  [5 cmH20] 5 cmH20 Pressure Support:  [5 cmH20] 5 cmH20 Plateau Pressure:  [17 cmH20-19 cmH20] 18 cmH20   Intake/Output Summary (Last 24 hours) at 01/10/2019 0834 Last data filed at 01/10/2019 0800 Gross per 24 hour  Intake 1430 ml  Output 2835 ml  Net -1405 ml   Filed Weights   01/04/19 0500 01/06/19 0500 01/10/19 0400  Weight: 37.9 kg 46.8 kg 46.8 kg   Physical Exam: General: Adult female, frail, resting in bed, in NAD. Neuro: Eyes open but not responsive despite no sedation.  Does have some purposeful movement on RUE however. HEENT: [place]/AT. Sclerae anicteric. Disconjugate gaze.  EVD in place and open. Cardiovascular: RRR, no M/R/G.  Lungs: Respirations even and unlabored.  CTA bilaterally, No W/R/R. Abdomen: BS x 4, soft, NT/ND.  Musculoskeletal: No gross deformities, no edema.  Skin: Intact, warm, no rashes.  Assessment & Plan:  63 year old female with intracranial mass s/p intervention with minimal to no meaningful neurologic recovery off of sedation. She is not a candidate for extubation due to inability to protect her airway. Neurosurgery and Palliative care team have held discussions regarding goals of care with pending decision on 01/10/19. If family wishes to pursue aggressive care (tracheostomy, PEG), she will likely remain dependent for care and will require long term facility placement. Pathology pending although likelihood of salvageability is minimal per neurosurgery.  # Postoperative respiratory failure  -related to inability to protect airway and likely aspiration # Community-acquired pneumonia s/p antibiotics  P: -Continue full vent support. - No weaning due to mental status. - Bronchial  hygiene. - Family discussion pending regarding tracheostomy today 12/22 (no show 12/21, planning for 12/22 after daughter arrives from [facility]).  # Hemorrhagic intracranial mass  -with pathology pending, status post debulking, residual  tumor in the brainstem -Poor prognosis  P: - EVD management per NSG - GOC per NSG  # Reported progressive dementia  - Unclear etiology however suspect probably more related to intracranial mass  # Severe protein calorie malnutrition - present on admission P: - Continue nutritional support with tube feeds  # Stage 3 pressure ulcer- sacral, present on admission - WOC following  #Anemia-slowly downtrending throughout hospitalization; however, has remained hemodynamically stable. Plan - Continue to monitor - Consider transfusion for hemoglobin less than 7 and/or signs and symptoms of hemodynamic instability  Best practice:  Diet: TF Pain/Anxiety/Delirium protocol (if indicated): fentanyl prn VAP protocol (if indicated): In place DVT prophylaxis: SCDs, subq Heparin GI prophylaxis: PPI Glucose control: SSI Mobility: BR Code Status: Full Family Communication: discussions undertaken by Dr Zada Finders, Palliative Care involved.  Plan to have decision regarding trach vs comfort care today 12/22 after daughter arrives from [facility]. Disposition: continue ICU   CC time: 30 min.   Montey Hora, [place] [facility] 01/10/2019, 8:34 AM

## 2019-01-11 LAB — CBC
HCT: 24.3 % — ABNORMAL LOW (ref 36.0–46.0)
Hemoglobin: 7.7 g/dL — ABNORMAL LOW (ref 12.0–15.0)
MCH: 31.3 pg (ref 26.0–34.0)
MCHC: 31.7 g/dL (ref 30.0–36.0)
MCV: 98.8 fL (ref 80.0–100.0)
Platelets: 560 10*3/uL — ABNORMAL HIGH (ref 150–400)
RBC: 2.46 MIL/uL — ABNORMAL LOW (ref 3.87–5.11)
RDW: 13.8 % (ref 11.5–15.5)
WBC: 15.5 10*3/uL — ABNORMAL HIGH (ref 4.0–10.5)
nRBC: 0 % (ref 0.0–0.2)

## 2019-01-11 LAB — GLUCOSE, CAPILLARY
Glucose-Capillary: 111 mg/dL — ABNORMAL HIGH (ref 70–99)
Glucose-Capillary: 115 mg/dL — ABNORMAL HIGH (ref 70–99)
Glucose-Capillary: 117 mg/dL — ABNORMAL HIGH (ref 70–99)
Glucose-Capillary: 122 mg/dL — ABNORMAL HIGH (ref 70–99)
Glucose-Capillary: 125 mg/dL — ABNORMAL HIGH (ref 70–99)
Glucose-Capillary: 84 mg/dL (ref 70–99)

## 2019-01-11 LAB — BASIC METABOLIC PANEL
Anion gap: 11 (ref 5–15)
BUN: 17 mg/dL (ref 8–23)
CO2: 23 mmol/L (ref 22–32)
Calcium: 8.2 mg/dL — ABNORMAL LOW (ref 8.9–10.3)
Chloride: 101 mmol/L (ref 98–111)
Creatinine, Ser: 0.58 mg/dL (ref 0.44–1.00)
GFR calc Af Amer: >60 mL/min (ref >60)
GFR calc non Af Amer: >60 mL/min (ref >60)
Glucose, Bld: 124 mg/dL — ABNORMAL HIGH (ref 70–99)
Potassium: 4.4 mmol/L (ref 3.5–5.1)
Sodium: 135 mmol/L (ref 135–145)

## 2019-01-11 MED ORDER — OSMOLITE 1.2 CAL PO LIQD
1000.0000 mL | ORAL | Status: DC
  Administered 2019-01-11 – 2019-01-12 (×2): 1000 mL
  Filled 2019-01-11: qty 1000

## 2019-01-11 MED ORDER — MORPHINE SULFATE (PF) 2 MG/ML IV SOLN: 1.0000 mg | INTRAVENOUS | Status: DC | PRN

## 2019-01-11 NOTE — Progress Notes (Signed)
Neurosurgery Service Progress Note  Subjective: No acute events overnight   Objective: Vitals:   01/11/19 1500 01/11/19 1600 01/11/19 1700 01/11/19 1800  BP: 104/87 125/87 95/67 111/76  Pulse: 98 (!) 103 100 (!) 109  Resp: (!) 23 (!) 22 (!) 22 (!) 22  Temp: 99.5 F (37.5 C) 99.5 F (37.5 C) 100.2 F (37.9 C) (!) 100.4 F (38 C)  TempSrc:  Bladder    SpO2: 100% 100% 100% 100%  Weight:      Height:       Temp (24hrs), Avg:100.5 F (38.1 C), Min:99.5 F (37.5 C), Max:102.4 F (39.1 C)  CBC Latest Ref Rng & Units 01/11/2019 01/10/2019 01/09/2019  WBC 4.0 - 10.5 K/uL 15.5(H) 14.4(H) 15.4(H)  Hemoglobin 12.0 - 15.0 g/dL 7.7(L) 7.1(L) 7.2(L)  Hematocrit 36.0 - 46.0 % 24.3(L) 21.6(L) 22.3(L)  Platelets 150 - 400 K/uL 560(H) 502(H) 276   BMP Latest Ref Rng & Units 01/11/2019 01/10/2019 01/09/2019  Glucose 70 - 99 mg/dL 124(H) 124(H) 110(H)  BUN 8 - 23 mg/dL _0 Creatinine 0.44 - 1.00 mg/dL 0.58 0.35(L) 0.37(L)  BUN/Creat Ratio 12 - 28 - - -  Sodium 135 - 145 mmol/L 135 135 134(L)  Potassium 3.5 - 5.1 mmol/L 4.4 4.3 4.2  Chloride 98 - 111 mmol/L 101 98 101  CO2 22 - 32 mmol/L _1 Calcium 8.9 - 10.3 mg/dL 8.2(L) 8.3(L) 8.0(L)    Intake/Output Summary (Last 24 hours) at 01/11/2019 1806 Last data filed at 01/11/2019 1800 Gross per 24 hour  Intake 1325 ml  Output 2261 ml  Net -936 ml    Current Facility-Administered Medications:  .  acetaminophen (TYLENOL) 160 MG/5ML solution 650 mg, 650 mg, Per Tube, Q4H PRN, Judith Part, MD, 650 mg at 01/11/19 0824 .  [DISCONTINUED] acetaminophen (TYLENOL) tablet 650 mg, 650 mg, Oral, Q4H PRN **OR** acetaminophen (TYLENOL) suppository 650 mg, 650 mg, Rectal, Q4H PRN, ,  A, MD .  chlorhexidine gluconate (MEDLINE KIT) (PERIDEX) 0.12 % solution 15 mL, 15 mL, Mouth Rinse, BID, ,  A, MD, 15 mL at 01/11/19 0800 .  Chlorhexidine Gluconate Cloth 2 % PADS 6 each, 6 each, Topical, Daily, Judith Part, MD, 6 each at 01/11/19 0800 .  feeding supplement (OSMOLITE 1.2 CAL) liquid 1,000 mL, 1,000 mL, Per Tube, Continuous, Basilio Cairo, NP, Last Rate: 30 mL/hr at 01/11/19 1600, 1,000 mL at 01/11/19 1600 .  heparin injection 5,000 Units, 5,000 Units, Subcutaneous, Q8H, , Joyice Faster, MD, 5,000 Units at 01/11/19 1400 .  insulin aspart (novoLOG) injection 0-24 Units, 0-24 Units, Subcutaneous, Q4H, Candee Furbish, MD, 2 Units at 01/11/19 1558 .  labetalol (NORMODYNE) injection 10-40 mg, 10-40 mg, Intravenous, Q10 min PRN, Judith Part, MD, 10 mg at 01/04/19 0130 .  MEDLINE mouth rinse, 15 mL, Mouth Rinse, 10 times per day, Judith Part, MD, 15 mL at 01/11/19 1558 .  morphine 2 MG/ML injection 1 mg, 1 mg, Intravenous, Q4H PRN, Basilio Cairo, NP .  multivitamin with minerals tablet 1 tablet, 1 tablet, Per Tube, Daily, [place], Rose Fillers, MD, 1 tablet at 01/11/19 1030 .  nutrition supplement (JUVEN) (JUVEN) powder packet 1 packet, 1 packet, Per Tube, BID BM, Judith Part, MD, 1 packet at 01/11/19 1400 .  ondansetron (ZOFRAN) tablet 4 mg, 4 mg, Oral, Q4H PRN **OR** ondansetron (ZOFRAN) injection 4 mg, 4 mg, Intravenous, Q4H PRN, ,  A, MD .  pantoprazole sodium (PROTONIX) 40 mg/20  mL oral suspension 40 mg, 40 mg, Per Tube, Daily, Alfonzo Feller, NP, 40 mg at 01/11/19 1031 .  polyethylene glycol (MIRALAX / GLYCOLAX) packet 17 g, 17 g, Oral, Daily PRN, ,  A, MD .  potassium & sodium phosphates (PHOS-NAK) 280-160-250 MG packet 1 packet, 1 packet, Per Tube, TID WC & HS, Paulita Fujita B, NP, 1 packet at 01/11/19 1600   Physical Exam: Intubated, left greater than eye opening spontaneously, +R 4th nerve palsy, pupil neutral position on left, not FC some non-purposeful movement in the RUE w/ increased tone, LUE minimal withdrawal, BLE min withdrawal Incision / drain site c/d/i  Assessment & Plan: 63 y.o. 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 12/12 post-op MRI w/ expected residual tumor in brainstem, good debulking, scattered areas of diffusion changes including posterior pons likely 2/2 hemorrhage event, unclear pattern. 12/14 EEG w/ some focal spikes, no epileptiform activity, CT CAP with consolidation, no primary tumor, 12/15 rpt CTH w/ improved shift / pneumocephalus, ventriculomegaly, 12/16 rpt CTH slightly increased R subdural collection, EVD raised to +15  -CCM recs -EVD at +20 -path still pending -SCDs/TEDs, SQH -family opted for compassionate extubation tomorrow at 14:00  Judith Part  01/11/19 6:06 PM

## 2019-01-11 NOTE — Progress Notes (Signed)
RN contacted [facility] to update them on plan of care. See sticky note for details.

## 2019-01-11 NOTE — Progress Notes (Signed)
NAME:  Jasmine [name], MRN:  [id number], DOB:  13-Dec-1955, LOS: 5 ADMISSION DATE:  01/15/2019, CONSULTATION DATE:  01/01/19 REFERRING MD:  Zada Finders, CHIEF COMPLAINT:  Comatose   Brief History   63 year old woman with a history of possible dementia presenting with progressive neurological decline found to have large hemorrhagic right sided intracranial mass status post craniotomy and persistent respiratory failure postop.  History of present illness   63 year old woman with a history of possible dementia presenting with progressive neurological decline found to have large hemorrhagic right sided intracranial mass status post craniotomy and persistent respiratory failure postop.  After discussion with husband regarding poor prognosis, patient was taken to the operating room for debulking, hematoma removal, and EVD placement by Dr. Zada Finders on 01/16/2019.  She remains on the ventilator postop and PCCM consulted to help manage.  Past Medical History  Dementia versus enlarging intracranial mass  Significant Hospital Events   01/12/2019 craniotomy and right intracranial tumor resection 12/22 > PMT hoping to meet with family  Consults:  PCCM Palliative Care  Procedures:  See significant Hospital events  Significant Diagnostic Tests:  12/18 CT Head  > lateral ventricles have increased in size since prior.  Otherwise no sig change.  Hemorrhagic mass centered within the right thalamus with associated ME upon the midbrain with 35mm leftward MLS.  Persistent IV hemorrhage.  Micro Data:  12/11 COVID >  Neg 12/11 Blood cultures x 2 > Neg  Antimicrobials:  Ceftriaxone 12/13 >>12/17  Interim history/subjective:  After no-showing on 12/22, family discussed GOC with Palliative yesterday and requesting additional time to consider. Primary team ([place]) has requested family to come to a decision tomorrow.  Overnight febrile to 102. Current T 100.4  Objective   Blood pressure 100/74, pulse (!)  103, temperature (!) 100.4 F (38 C), resp. rate 16, height 5\' 6"  (1.676 m), weight 45.6 kg, SpO2 100 %.    Vent Mode: PSV;CPAP FiO2 (%):  [40 %] 40 % Set Rate:  [10 bmp] 10 bmp Vt Set:  [470 mL] 470 mL PEEP:  [5 cmH20] 5 cmH20 Pressure Support:  [5 cmH20] 5 cmH20 Plateau Pressure:  [16 cmH20-22 cmH20] 16 cmH20   Intake/Output Summary (Last 24 hours) at 01/11/2019 [id number] Last data filed at 01/11/2019 0700 Gross per 24 hour  Intake 1210 ml  Output 2917 ml  Net -1707 ml   Filed Weights   01/06/19 0500 01/10/19 0400 01/11/19 0500  Weight: 46.8 kg 46.8 kg 45.6 kg   Physical Exam: General: Adult female appears younger than stated age, shaved, does not open eyes to voice HENT: [place], AT, OP, ETT in place Eyes: EOMI, no scleral icterus, no tracking Respiratory: Anterior upper airway rhonchi present Cardiovascular: RRR, -M/R/G, no JVD GI: BS+, soft, nontender Extremities:-Edema,-tenderness Neuro: Does not open eyes to voice, touch or pain. PERRL, no tracking. Gag reflex present. Withdraws slightly to pain x 4 GU: Foley in place  CXR 01/11/19 - ETT in place. No effusion, edema or infiltrate  Assessment & Plan:  63 year old female with intracranial mass s/p intervention with minimal to no meaningful neurologic recovery off of sedation. She is not a candidate for extubation due to inability to protect her airway. Neurosurgery and Palliative care team have held discussions regarding goals of care with pending decision on 01/10/19. If family wishes to pursue aggressive care (tracheostomy, PEG), she will likely remain dependent for care and will require long term facility placement. Pathology pending although likelihood of salvageability is minimal per neurosurgery.  #  Postoperative respiratory failure  -related to inability to protect airway and likely aspiration. Extubation precluded by mental status P: - Full vent support. PS as tolerated however no plan to extubate due to mental status -  Bronchial hygiene. - Family discussion pending regarding tracheostomy today 12/23   # Hemorrhagic intracranial mass  -with pathology pending, status post debulking, residual tumor in the brainstem -Poor prognosis  P: - EVD management per NSG - GOC per NSG  # Fever # Community-acquired pneumonia s/p antibiotics  Possible neuro etiology. No clear evidence of infection at this time. No old lines. P: PRN tylenol Monitor WBC/fever curve Re-culture if recurrent fever Remove foley, bladder checks q8h  # Reported progressive dementia  - Unclear etiology however suspect probably more related to intracranial mass  # Severe protein calorie malnutrition - present on admission P: - Continue nutritional support with tube feeds  # Stage 3 pressure ulcer- sacral, present on admission - WOC following  #Anemia-slowly downtrending throughout hospitalization; however, has remained hemodynamically stable. Plan - Continue to monitor - Consider transfusion for hemoglobin less than 7 and/or signs and symptoms of hemodynamic instability  Best practice:  Diet: TF Pain/Anxiety/Delirium protocol (if indicated): fentanyl prn VAP protocol (if indicated): In place DVT prophylaxis: SCDs, subq Heparin GI prophylaxis: PPI Glucose control: SSI Mobility: BR Code Status: Full Family Communication: discussions undertaken by Dr Zada Finders, Palliative Care involved.  Plan to have decision regarding trach vs comfort care today 12/23 per Palliative note Disposition: continue ICU   The patient is critically ill with multiple organ systems failure and requires high complexity decision making for assessment and support, frequent evaluation and titration of therapies, application of advanced monitoring technologies and extensive interpretation of multiple databases.   Critical Care Time devoted to patient care services described in this note is 33 Minutes.   Rodman Pickle, M.D. [facility] Pulmonary/Critical Care  Medicine 01/11/2019 8:14 AM   Please see Amion for pager number to reach on-call Pulmonary and Critical Care Team.

## 2019-01-11 NOTE — Progress Notes (Addendum)
Daily Progress Note   Patient Name: Jasmine [name]       Date: 01/11/2019 DOB: [birth year]  Age: 63 y.o. MRN#: [id number] Attending Physician: Judith Part, MD Primary Care Physician: Chevis Pretty, FNP Admit Date: 12/22/2018  Reason for Consultation/Follow-up: Establishing goals of care  Subjective: Intubated, off sedation. Resting comfortably during visit. Not as interactive as yesterday.  GOC:  F/u GOC with husband and daughter Warren Lacy and Danette) via telephone.  Danette shares they have to "do what's best for mommy." Warren Lacy and Danette have made the very humbling decision to compassionately extubate and shift to comfort focused care plan, understanding poor prognosis. They do not want her to "suffer." Discussed process of compassionate extubation to comfort including utilization of symptom management medications to ensure comfort and relief from suffering. Explained that interventions not aimed at comfort will be discontinued including tube feeds. Educated on comfort feeds, aspiration risk, and aspiration precautions. Prepared them for 'anything to happen at anytime' with poor prognosis of likely hours-days following extubation.   Medically recommended DNR code status, in order to allow Ayra to pass comfortably, peacefully, and naturally. Warren Lacy and Danette agree with decision for DNR code status change.   Family is ready to extubate her tomorrow afternoon when patient's sister arrives. Explained that I am off service starting tomorrow but reassured of support from another palliative provider during the process.   Emotional support provided. Family has PMT contact information.    Length of Stay: 12  Current Medications: Scheduled Meds:  . chlorhexidine gluconate  (MEDLINE KIT)  15 mL Mouth Rinse BID  . Chlorhexidine Gluconate Cloth  6 each Topical Daily  . heparin injection (subcutaneous)  5,000 Units Subcutaneous Q8H  . insulin aspart  0-24 Units Subcutaneous Q4H  . mouth rinse  15 mL Mouth Rinse 10 times per day  . multivitamin with minerals  1 tablet Per Tube Daily  . nutrition supplement (JUVEN)  1 packet Per Tube BID BM  . pantoprazole sodium  40 mg Per Tube Daily  . potassium & sodium phosphates  1 packet Per Tube TID WC & HS    Continuous Infusions: . feeding supplement (OSMOLITE 1.2 CAL) 55 mL/hr at 01/11/19 0700    PRN Meds: acetaminophen (TYLENOL) oral liquid 160 mg/5 mL, [DISCONTINUED] acetaminophen **OR** acetaminophen, fentaNYL (SUBLIMAZE) injection, labetalol, ondansetron **OR**  ondansetron (ZOFRAN) IV, polyethylene glycol  Physical Exam Vitals and nursing note reviewed.  Constitutional:      Appearance: She is ill-appearing.     Interventions: She is intubated.  Cardiovascular:     Rate and Rhythm: Tachycardia present.  Pulmonary:     Effort: No tachypnea, accessory muscle usage or respiratory distress. She is intubated.     Breath sounds: Normal breath sounds.     Comments: Full vent support Abdominal:     Tenderness: There is no abdominal tenderness.  Skin:    General: Skin is warm and dry.  Neurological:     Comments: Intermittently will open eyes to voice. Left hemiplegia.             Vital Signs: BP 116/82 (BP Location: Right Arm)   Pulse (!) 112   Temp (!) 100.6 F (38.1 C) (Bladder)   Resp (!) 26   Ht 5' 6"  (1.676 m)   Wt 45.6 kg   SpO2 100%   BMI 16.23 kg/m  SpO2: SpO2: 100 % O2 Device: O2 Device: Ventilator O2 Flow Rate:    Intake/output summary:   Intake/Output Summary (Last 24 hours) at 01/11/2019 1338 Last data filed at 01/11/2019 1200 Gross per 24 hour  Intake 1210 ml  Output 2437 ml  Net -1227 ml   LBM: Last BM Date: 01/08/19 Baseline Weight: Weight: 41.8 kg Most recent weight: Weight:  45.6 kg       Palliative Assessment/Data: PPS 30%      Patient Active Problem List   Diagnosis Date Noted  . Protein-calorie malnutrition, severe 01/03/2019  . Palliative care by specialist   . Goals of care, counseling/discussion   . Respiratory failure ([place])   . Pressure injury of skin 12/21/2018  . ICH (intracerebral hemorrhage) ([place]) 12/28/2018  . Dementia ([place]) 05/03/2017  . Dermatitis 08/05/2015  . Chronic back pain 12/06/2013    Palliative Care Assessment & Plan   Patient Profile: 63 y.o. female  with past medical history of depression, anxiety, progressive dementia, and epilepsy admitted on 12/25/2018 with acute obtunded state at home. CT with large right hemorrhagic hemispheric mass invading thalamus with associated hemorrhage and hydrocephalus. 12/12 left EVD placed and s/p craniotomy for right tumor debulking and hematoma evacuation. Patient remains on ventilator post-op and PCCM following. Abnormal CT chest consistent with left lower lobe pneumonia, receiving ceftriaxone. EEG without evidence of active seizures, consistent with encephalopathy. Prognosis poor with current exam and MRI findings. Palliative medicine consultation for goals of care.   Assessment: Hemorrhagic intracranial mass s/p debulking Postoperative respiratory failure requiring mechanical ventilation Left lower lobe pneumonia Progressive dementia Severe protein calorie malnutrition  Recommendations/Plan:  Understanding medical diagnoses and poor prognosis, family decision to compassionately extubate to comfort measures only. Tentatively planned for 12/24 afternoon or 12/25 AM. Waiting on family to arrive. PMT provider to follow up tomorrow.   Husband and daughter agree with medical recommendation for code status change to DNR. Notified RN.  Continue current plan of care this evening with plans to extubate to comfort when family ready. Will order prn low dose morphine for pain/dyspnea.   Code  Status: DNR   Code Status Orders  (From admission, onward)         Start     Ordered   12/20/2018 0345  Full code  Continuous     01/12/2019 0344        Code Status History    This patient has a current code status but no historical  code status.   Advance Care Planning Activity       Prognosis:   Poor prognosis likely hours-days once extubated  Discharge Planning:  To Be Determined  Care plan was discussed with RN, husband, daughter  Thank you for allowing the Palliative Medicine Team to assist in the care of this patient.   Time In: 1325- 1500-  Time Out: 1335 1525 Total Time 35 Prolonged Time Billed no      Greater than 50%  of this time was spent counseling and coordinating care related to the above assessment and plan.  Ihor Dow, DNP, FNP-C Palliative Medicine Team  Phone: [phone] Fax: [phone]  Please contact Palliative Medicine Team phone at [phone] for questions and concerns.

## 2019-01-12 LAB — GLUCOSE, CAPILLARY
Glucose-Capillary: 92 mg/dL (ref 70–99)
Glucose-Capillary: 108 mg/dL — ABNORMAL HIGH (ref 70–99)
Glucose-Capillary: 101 mg/dL — ABNORMAL HIGH (ref 70–99)

## 2019-01-12 MED ORDER — GLYCOPYRROLATE 1 MG PO TABS
1.0000 mg | ORAL_TABLET | ORAL | Status: DC | PRN
  Filled 2019-01-12: qty 1

## 2019-01-12 MED ORDER — MORPHINE 100MG IN NS 100ML (1MG/ML) PREMIX INFUSION
1.0000 mg/h | INTRAVENOUS | Status: DC
  Administered 2019-01-12: 1 mg/h via INTRAVENOUS
  Administered 2019-01-13: 5 mg/h via INTRAVENOUS
  Filled 2019-01-12 (×2): qty 100

## 2019-01-12 MED ORDER — PROPOFOL 1000 MG/100ML IV EMUL
5.0000 ug/kg/min | INTRAVENOUS | Status: DC
  Administered 2019-01-12: 5 ug/kg/min via INTRAVENOUS
  Filled 2019-01-12 (×2): qty 100

## 2019-01-12 MED ORDER — POLYVINYL ALCOHOL 1.4 % OP SOLN
1.0000 [drp] | Freq: Four times a day (QID) | OPHTHALMIC | Status: DC | PRN
  Filled 2019-01-12: qty 15

## 2019-01-12 MED ORDER — GLYCOPYRROLATE 0.2 MG/ML IJ SOLN
0.2000 mg | INTRAMUSCULAR | Status: DC | PRN
  Administered 2019-01-12 – 2019-01-19 (×6): 0.2 mg via INTRAVENOUS
  Filled 2019-01-12 (×7): qty 1

## 2019-01-12 MED ORDER — MORPHINE BOLUS VIA INFUSION
2.0000 mg | INTRAVENOUS | Status: DC | PRN
  Filled 2019-01-12: qty 2

## 2019-01-12 MED ORDER — GLYCOPYRROLATE 0.2 MG/ML IJ SOLN
0.2000 mg | INTRAMUSCULAR | Status: DC | PRN
  Administered 2019-01-18: 19:00:00 0.2 mg via SUBCUTANEOUS
  Filled 2019-01-12: qty 1

## 2019-01-12 NOTE — Plan of Care (Signed)
  Problem: Pain Managment: Goal: General experience of comfort will improve Outcome: Progressing   

## 2019-01-12 NOTE — Procedures (Signed)
Extubation Procedure Note  Patient Details:   Name: Jasmine [name] DOB: 1955-07-12 MRN: [id number]   Airway Documentation:    Vent end date: 01/12/19 Vent end time: 1734   Evaluation  O2 sats: stable throughout Complications: No apparent complications Patient did tolerate procedure well. Bilateral Breath Sounds: Clear   No, pt could not speak post extubation.  Pt extubated to room air per physician's order and in accordance with the family's wishes.  Family at bedside at this time.  Earney Navy 01/12/2019, 5:34 PM

## 2019-01-12 NOTE — Progress Notes (Signed)
NAME:  Jasmine [name], MRN:  [id number], DOB:  [birth year], LOS: 61 ADMISSION DATE:  01/11/2019, CONSULTATION DATE:  01/01/19 REFERRING MD:  Zada Finders, CHIEF COMPLAINT:  Comatose   Brief History   63 year old woman with a history of possible dementia presenting with progressive neurological decline found to have large hemorrhagic right sided intracranial mass status post craniotomy and persistent respiratory failure postop.  History of present illness   63 year old woman with a history of possible dementia presenting with progressive neurological decline found to have large hemorrhagic right sided intracranial mass status post craniotomy and persistent respiratory failure postop.  After discussion with husband regarding poor prognosis, patient was taken to the operating room for debulking, hematoma removal, and EVD placement by Dr. Zada Finders on 01/19/2019.  She remains on the ventilator postop and PCCM consulted to help manage.  Past Medical History  Dementia versus enlarging intracranial mass  Significant Hospital Events   01/17/2019 craniotomy and right intracranial tumor resection 12/22 > PMT hoping to meet with family  Consults:  PCCM Palliative Care  Procedures:  See significant Hospital events  Significant Diagnostic Tests:  12/18 CT Head  > lateral ventricles have increased in size since prior.  Otherwise no sig change.  Hemorrhagic mass centered within the right thalamus with associated ME upon the midbrain with 18mm leftward MLS.  Persistent IV hemorrhage.  Micro Data:  12/11 COVID >  Neg 12/11 Blood cultures x 2 > Neg  Antimicrobials:  Ceftriaxone 12/13 >>12/17  Interim history/subjective:  Family plans for compassionate extubation today Remains on vent  Objective   Blood pressure 123/79, pulse (!) 109, temperature 100 F (37.8 C), resp. rate (!) 30, height 5\' 6"  (1.676 m), weight 45.2 kg, SpO2 100 %.    Vent Mode: PSV;CPAP FiO2 (%):  [40 %] 40 % Set Rate:  [10  bmp] 10 bmp Vt Set:  [470 mL] 470 mL PEEP:  [5 cmH20] 5 cmH20 Pressure Support:  [5 cmH20] 5 cmH20 Plateau Pressure:  [19 cmH20-20 cmH20] 20 cmH20   Intake/Output Summary (Last 24 hours) at 01/12/2019 [id number] Last data filed at 01/12/2019 0900 Gross per 24 hour  Intake 955 ml  Output 1437 ml  Net -482 ml   Filed Weights   01/10/19 0400 01/11/19 0500 01/12/19 0500  Weight: 46.8 kg 45.6 kg 45.2 kg   Physical Exam: General: Adult female appears younger than stated age, shaved head, unresponsive, opens eyes spontanously HENT: [place], AT, ETT in place Eyes: EOMI, no scleral icterus Respiratory: Clear to auscultation bilaterally.  No crackles, wheezing or rales Cardiovascular: RRR, -M/R/G, no JVD GI: BS+, soft, nontender Extremities:-Edema,-tenderness Neuro: Opens eyes spontaneously, does not blink to threat, moves extremities spontaneously x 4, does not follow commands, no withdrawal to pain,  GU: Foley in place  Assessment & Plan:  63 year old female with intracranial mass s/p intervention with minimal to no meaningful neurologic recovery off of sedation. She is not a candidate for extubation due to inability to protect her airway. Neurosurgery and Palliative care team have held discussions regarding goals of care with pending decision on 01/10/19. If family wishes to pursue aggressive care (tracheostomy, PEG), she will likely remain dependent for care and will require long term facility placement. Pathology pending although likelihood of salvageability is minimal per neurosurgery.  # Postoperative respiratory failure  -related to inability to protect airway and likely aspiration. Extubation precluded by mental status P: - Family plans for compassionate extubation today - Full vent support until then - Bronchial hygiene.  #  Hemorrhagic intracranial mass  -with pathology pending, status post debulking, residual tumor in the brainstem -Poor prognosis  P: - EVD management per NSG - GOC  per NSG  # Fever # Community-acquired pneumonia s/p antibiotics  Possible neuro etiology. No clear evidence of infection at this time. No old lines. P: PRN tylenol Monitor WBC/fever curve  # Reported progressive dementia  - Unclear etiology however suspect probably more related to intracranial mass  # Severe protein calorie malnutrition - present on admission P: - Continue nutritional support with tube feeds  # Stage 3 pressure ulcer- sacral, present on admission - WOC following  #Anemia-slowly downtrending throughout hospitalization; however, has remained hemodynamically stable. Plan - Continue to monitor - Consider transfusion for hemoglobin less than 7 and/or signs and symptoms of hemodynamic instability  Best practice:  Diet: TF Pain/Anxiety/Delirium protocol (if indicated): fentanyl prn VAP protocol (if indicated): In place DVT prophylaxis: SCDs, subq Heparin GI prophylaxis: PPI Glucose control: SSI Mobility: BR Code Status: Full Family Communication: Per Dr Zada Finders, Palliative Care involved.  Disposition: Remain in ICU for compassionate extubation  The patient is critically ill requires high complexity decision making for assessment and support, frequent evaluation and titration of therapies, application of advanced monitoring technologies and extensive interpretation of multiple databases.   Critical Care Time devoted to patient care services described in this note is 31 Minutes.   Rodman Pickle, M.D. [facility] Pulmonary/Critical Care Medicine 01/12/2019 9:52 AM   Please see Amion for pager number to reach on-call Pulmonary and Critical Care Team.

## 2019-01-12 NOTE — Progress Notes (Signed)
Subjective: Patient continues on ventilator via ETT.  IVC continue to drain.  Objective: Vital signs in last 24 hours: Vitals:   01/12/19 0600 01/12/19 0700 01/12/19 0727 01/12/19 0800  BP: 107/71 116/79  113/83  Pulse: 92 95  96  Resp: 17 17  (!) 22  Temp: 98.8 F (37.1 C) 99 F (37.2 C)  99.5 F (37.5 C)  TempSrc:    Bladder  SpO2: 100% 100% 100% 100%  Weight:      Height:        Intake/Output from previous day: 12/23 0701 - 12/24 0700 In: 1005 [NG/GT:1005] Out: 1608 [Urine:1580; Drains:28] Intake/Output this shift: Total I/O In: 30 [NG/GT:30] Out: 3 [Drains:3]  Physical Exam: Opening eyes weakly spontaneously.  Gaze disconjugate.  Not following commands.  Limited spontaneous semipurposeful movement of right upper extremity, no significant movement of left upper extremity.  CBC Recent Labs    01/10/19 0608 01/11/19 0547  WBC 14.4* 15.5*  HGB 7.1* 7.7*  HCT 21.6* 24.3*  PLT 502* 560*   BMET Recent Labs    01/10/19 0608 01/11/19 0547  NA 135 135  K 4.3 4.4  CL 98 101  CO2 25 23  GLUCOSE 124* 124*  BUN 12 17  CREATININE 0.35* 0.58  CALCIUM 8.3* 8.2*    Assessment/Plan: No significant change in patient from neurologic perspective.  Continuing current care.  Appreciate assistance of palliative care and critical care.   Hosie Spangle, MD 01/12/2019, 8:51 AM

## 2019-01-13 DIAGNOSIS — L89309 Pressure ulcer of unspecified buttock, unspecified stage: Secondary | ICD-10-CM

## 2019-01-13 NOTE — Progress Notes (Signed)
Patient ID: Jasmine [name], female   DOB: [birth year], 63 y.o.   MRN: [id number] BP 101/66   Pulse 92   Temp (!) 96.9 F (36.1 C)   Resp (!) 25   Ht 5\' 6"  (1.676 m)   Wt 45.2 kg   SpO2 (!) 88%   BMI 16.08 kg/m  Not responsive.  Drain remains in place.  Prognosis is grim

## 2019-01-13 NOTE — Progress Notes (Signed)
  Palliative Medicine Inpatient Follow Up Note  This NP visited patient at the bedside as a follow up to  [facility] compassionate extubation. Patient appeared comfortable on morphine and propofol gtts.   Palliative will continue to follow and offer support.   Time Spent: 15 Greater than 50% of the time was spent in counseling and coordination of care  Tacey Ruiz, NP Alda Lea, NP [place] Team Team Cell Phone: [phone]  Please utilize secure chat with additional questions, if there is no response within 30 minutes please call the above phone number  Palliative Medicine Team providers are available by phone from 7am to 7pm daily and can be reached through the team cell phone.  Should this patient require assistance outside of these hours, please call the patient's attending physician.

## 2019-01-13 NOTE — Progress Notes (Signed)
Subjective: Patient extubated yesterday per palliative care.  Breathing comfortably on room air.  Objective: Vital signs in last 24 hours: Vitals:   01/13/19 0300 01/13/19 0400 01/13/19 0500 01/13/19 0600  BP:      Pulse: (!) 101 99 93 92  Resp:      Temp: 99.4 F (37.4 C) 99.6 F (37.6 C) 99.3 F (37.4 C) 99 F (37.2 C)  TempSrc:      SpO2: 92% 91% 91% 92%  Weight:      Height:        Intake/Output from previous day: 12/24 0701 - 12/25 0700 In: 104.4 [I.V.:44.4; NG/GT:60] Out: 7 [Drains:7] Intake/Output this shift: No intake/output data recorded.  Physical Exam: Opening eyes weakly (less vigorously than yesterday).  Less spontaneous movement of extremities.  CBC Recent Labs    01/11/19 0547  WBC 15.5*  HGB 7.7*  HCT 24.3*  PLT 560*   BMET Recent Labs    01/11/19 0547  NA 135  K 4.4  CL 101  CO2 23  GLUCOSE 124*  BUN 17  CREATININE 0.58  CALCIUM 8.2*    Assessment/Plan: Overall less responsive following extubation yesterday.  Continue current care.  Hosie Spangle, MD 01/13/2019, 8:15 AM

## 2019-01-13 NOTE — Progress Notes (Signed)
PCCM Progress Note  Patient compassionately extubated yesterday. No longer requiring mechanical ventilation or respiratory support. Pulmonary team will sign off.  Rodman Pickle, M.D. [facility] Pulmonary/Critical Care Medicine 01/13/2019 7:50 AM

## 2019-01-14 MED ORDER — SODIUM CHLORIDE 0.9 % IV SOLN
1.0000 mg/h | INTRAVENOUS | Status: DC
  Administered 2019-01-14: 1 mg/h via INTRAVENOUS
  Administered 2019-01-14 – 2019-01-20 (×8): 2 mg/h via INTRAVENOUS
  Filled 2019-01-14: qty 5
  Filled 2019-01-14: qty 2.5
  Filled 2019-01-14 (×2): qty 5
  Filled 2019-01-14: qty 2.5
  Filled 2019-01-14 (×2): qty 5
  Filled 2019-01-14 (×2): qty 2.5
  Filled 2019-01-14 (×2): qty 5
  Filled 2019-01-14: qty 2.5
  Filled 2019-01-14: qty 5

## 2019-01-14 MED ORDER — LORAZEPAM 2 MG/ML IJ SOLN
1.0000 mg | Freq: Four times a day (QID) | INTRAMUSCULAR | Status: DC
  Administered 2019-01-14 – 2019-01-20 (×25): 1 mg via INTRAVENOUS
  Filled 2019-01-14 (×25): qty 1

## 2019-01-14 MED ORDER — HYDROMORPHONE BOLUS VIA INFUSION
1.0000 mg | INTRAVENOUS | Status: DC | PRN
  Administered 2019-01-16 – 2019-01-20 (×16): 1 mg via INTRAVENOUS
  Filled 2019-01-14: qty 1

## 2019-01-14 NOTE — Progress Notes (Signed)
Updated Warren Lacy (husband) on the patient's status and pending transfer to [phone].   Danette (daughter) had asked about getting the CD of a previous MRI returned to the family. Arnetha Massy, NP, informed me that they could have it back and was no longer needed. I informed Warren Lacy that the CD would be returned to him on his next visit (currently in chart).

## 2019-01-14 NOTE — Progress Notes (Signed)
Daily Progress Note   Patient Name: Jasmine [name]       Date: 01/14/2019 DOB: September 30, 1955  Age: 63 y.o. MRN#: [id number] Attending Physician: Judith Part, MD Primary Care Physician: Chevis Pretty, FNP Admit Date: 01/15/2019  Reason for Consultation/Follow-up: Establishing goals of care and Psychosocial/spiritual support  Subjective: Patient seen at bedside.  Mouth open.  I'm concerned oxygen is only drying out her oral mucosa and of no benefit.  She is having diffuse myoclonic jerking (about once every 30 seconds or so) of arm, leg, body, eye lid.   Urine output is minimal.  She is likely in renal failure.  Drain output for last 24 hours appears minimal.  After speaking with bedside RN, NS APP, and NS MD, I spoke with her husband Jasmine [name].  We discussed the jerking motions and the change in pain medication.   He expressed a concern about her breathing being more like choking last night - I reassured him that her breathing is very relaxed and regular right now.  I explained that the drain is her head has minimal output and the NS says is can come out.  That should make her more comfortable.  Jasmine [name] was in agreement with doing this.  Jasmine [name] asked if she would stay in the same room.  I answered that she may move 2 floors up to the "pent house suite" where it is more quiet than ICU.    Jasmine [name] was very thankful for the care his wife is receiving and appreciative of the call.   Assessment: Patient nearing EOL.  Having myoclonic jerking.  ? Morphine toxicity    Patient Profile/HPI:  63 y.o. female  with past medical history of depression, anxiety, progressive dementia, and epilepsy admitted on 01/06/2019 with acute obtunded state at home. 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 12/12 left EVD placed and s/p craniotomy for right tumor debulking and hematoma evacuation. Patient remains on ventilator post-op and PCCM following. Abnormal CT chest consistent with left lower lobe pneumonia, receiving ceftriaxone. EEG without evidence of active seizures, consistent with encephalopathy. Prognosis poor with current exam and MRI findings. Palliative medicine consultation for goals of care.     Length of Stay: 15  Current Medications: Scheduled Meds:  . Chlorhexidine Gluconate Cloth  6 each  Topical Daily    Continuous Infusions: . HYDROmorphone    . propofol (DIPRIVAN) infusion Stopped (01/13/19 1945)    PRN Meds: [DISCONTINUED] acetaminophen **OR** acetaminophen, glycopyrrolate **OR** glycopyrrolate **OR** glycopyrrolate, HYDROmorphone, ondansetron **OR** ondansetron (ZOFRAN) IV, polyvinyl alcohol  Physical Exam        Thin frail female.  Appears to be nearing end of life.  Not responsive to voice or touch.  Myoclonic jerking noted. CV tachy Resp shallow and regular Abdomen thin, NT, ND   Vital Signs: BP 101/66   Pulse (!) 104   Temp (!) 97.2 F (36.2 C)   Resp 20   Ht 5\' 6"  (1.676 m)   Wt 45.2 kg   SpO2 94%   BMI 16.08 kg/m  SpO2: SpO2: 94 % O2 Device: O2 Device: Nasal Cannula O2 Flow Rate: O2 Flow Rate (L/min): 2 L/min  Intake/output summary:   Intake/Output Summary (Last 24 hours) at 01/14/2019 1015 Last data filed at 01/14/2019 0700 Gross per 24 hour  Intake 106.27 ml  Output 580 ml  Net -473.73 ml   LBM: Last BM Date: 01/11/19 Baseline Weight: Weight: 41.8 kg Most recent weight: Weight: 45.2 kg       Palliative Assessment/Data:  10%      Patient Active Problem List   Diagnosis Date Noted  . Protein-calorie malnutrition, severe 01/03/2019  . Palliative care by specialist   . Goals of care, counseling/discussion   . Respiratory failure ([place])   . Pressure injury of skin  12/29/2018  . ICH (intracerebral hemorrhage) ([place]) 12/29/2018  . Dementia ([place]) 05/03/2017  . Dermatitis 08/05/2015  . Chronic back pain 12/06/2013    Palliative Care Plan    Recommendations/Plan:  Comfort Measures  Will rotate from morphine to dilaudid given myoclonic jerking.  Will also schedule ativan.  Husband ok with removing drain and transferring to 6N.  PLease  Call husband with new room number.  PMT will check in to ensure comfort  Goals of Care and Additional Recommendations:  Limitations on Scope of Treatment: Full Comfort Care  Code Status:  DNR  Prognosis:   Hours - Days:  Minimal urine outpatient, shallow breathing   Discharge Planning:  Anticipated Hospital Death  Care plan was discussed with RN, MD, APP, husband.  Thank you for allowing the Palliative Medicine Team to assist in the care of this patient.  Total time spent:  45 min.     Greater than 50%  of this time was spent counseling and coordinating care related to the above assessment and plan.  Florentina Jenny, PA-C Palliative Medicine  Please contact Palliative MedicineTeam phone at [phone] for questions and concerns between 7 am - 7 pm.   Please see AMION for individual provider pager numbers.

## 2019-01-14 NOTE — Progress Notes (Signed)
Received from 4 N accompanied by Rn and NT. Patient unresponsive, Dilaudid drip ongoing.

## 2019-01-14 NOTE — Progress Notes (Signed)
Subjective: The patient is somnolent and in no apparent distress.  Objective: Vital signs in last 24 hours: Temp:  [94.9 F (34.9 C)-98.3 F (36.8 C)] 97.2 F (36.2 C) (12/26 0700) Pulse Rate:  [84-105] 104 (12/26 0700) Resp:  [18-26] 20 (12/26 0700) SpO2:  [83 %-98 %] 94 % (12/26 0700) Estimated body mass index is 16.08 kg/m as calculated from the following:   Height as of this encounter: 5\' 6"  (1.676 m).   Weight as of this encounter: 45.2 kg.   Intake/Output from previous day: 12/25 0701 - 12/26 0700 In: 116.3 [I.V.:116.3] Out: 703 [Urine:700; Drains:3] Intake/Output this shift: No intake/output data recorded.  Physical exam the patient's pupils are equal.  Her ventriculostomy is draining.  Lab Results: No results for input(s): WBC, HGB, HCT, PLT in the last 72 hours. BMET No results for input(s): NA, K, CL, CO2, GLUCOSE, BUN, CREATININE, CALCIUM in the last 72 hours.  Studies/Results: No results found.  Assessment/Plan: Brainstem hemorrhage, hydrocephalus: The patient is comfort care only.  I am told that the family does not want the ventriculostomy removed, so we will continue it for now.  It is okay with me if she goes to the floor with a ventriculostomy in.  LOS: 15 days     Ophelia Charter 01/14/2019, 9:48 AM

## 2019-01-15 DIAGNOSIS — I610 Nontraumatic intracerebral hemorrhage in hemisphere, subcortical: Secondary | ICD-10-CM

## 2019-01-15 DIAGNOSIS — Z515 Encounter for palliative care: Secondary | ICD-10-CM

## 2019-01-15 NOTE — Progress Notes (Signed)
Chaplain responded to spiritual care consult for DNR. Pt was minimally responsive.  Sister was present.  Facilitated storytelling and offered prayer.  Please call as needed for f/u.  Luana Shu [phone]      01/15/19 1252  Clinical Encounter Type  Visited With Patient and family together  Visit Type  (Spiritual consult)  Referral From Palliative care team  Consult/Referral To Chaplain  Spiritual Encounters  Spiritual Needs Prayer  Stress Factors  Patient Stress Factors Major life changes  Family Stress Factors Major life changes;Family relationships

## 2019-01-15 NOTE — Progress Notes (Signed)
Daily Progress Note   Patient Name: Jasmine [name]       Date: 01/15/2019 DOB: [birth year]  Age: 63 y.o. MRN#: [id number] Attending Physician: Judith Part, MD Primary Care Physician: Chevis Pretty, FNP Admit Date: 01/02/2019  Reason for Consultation/Follow-up: Establishing goals of care and Terminal Care  Subjective: Called by RN to meet patient's sister at bedside.  Sister was upset that patient is comfort measures.  We discussed the tumor and the drain removal.  Sister wanted to understand where the fluid is going now.  I explained that the amount of fluid decreased significantly and the drain was no longer needed.  She asked questions about feeding and IV nutrition.  I explained that when a person is nearing EOL and their organs begin to shut down (kidneys, gut, liver) the body could not process fluid and artificial nutrition if we put it in - if we did so we would make her breathing more difficult and cause discomfort.  Her sister seemed satisfied.  A cousin on the smart phone prayed with her.  Assessment: Patient appears comfortable.  Heart rate calm, breaths are shallow.  No further myoclonic jerking.   Patient Profile/HPI:  63 y.o.femalewith past medical history of depression, anxiety, progressive dementia, and epilepsyadmitted on 12/11/2020with acute obtunded state at home.CT with large right hemorrhagic hemispheric mass invading thalamus with associated hemorrhage and hydrocephalus. 12/12 left EVD placed and s/p craniotomy for right tumor debulking and hematoma evacuation. Patient remains on ventilator post-op and PCCM following. Abnormal CT chest consistent with left lower lobe pneumonia, receiving ceftriaxone. EEG without evidence of active seizures, consistent with  encephalopathy. Prognosis poor with current exam and MRI findings. Palliative medicine consultation for goals of care.     Length of Stay: 16  Current Medications: Scheduled Meds:  . Chlorhexidine Gluconate Cloth  6 each Topical Daily  . LORazepam  1 mg Intravenous Q6H    Continuous Infusions: . HYDROmorphone 2 mg/hr (01/15/19 1157)  . propofol (DIPRIVAN) infusion Stopped (01/13/19 1945)    PRN Meds: [DISCONTINUED] acetaminophen **OR** acetaminophen, glycopyrrolate **OR** glycopyrrolate **OR** glycopyrrolate, HYDROmorphone, ondansetron **OR** ondansetron (ZOFRAN) IV, polyvinyl alcohol  Physical Exam       Thin frail female, sleeping comfortably, non-responsive CV rrr resp shallow, no distress Abdomen soft, nd  Vital Signs: BP (!) 81/54 (BP Location: Left Arm)  Pulse 86   Temp (!) 97 F (36.1 C)   Resp 14   Ht 5\' 6"  (1.676 m)   Wt 45.2 kg   SpO2 99%   BMI 16.08 kg/m  SpO2: SpO2: 99 % O2 Device: O2 Device: Room Air O2 Flow Rate: O2 Flow Rate (L/min): 0.5 L/min(per palliative NP)  Intake/output summary:   Intake/Output Summary (Last 24 hours) at 01/15/2019 1435 Last data filed at 01/15/2019 0600 Gross per 24 hour  Intake 61.33 ml  Output 400 ml  Net -338.67 ml   LBM: Last BM Date: 01/11/19 Baseline Weight: Weight: 41.8 kg Most recent weight: Weight: 45.2 kg       Palliative Assessment/Data:  10%      Patient Active Problem List   Diagnosis Date Noted  . Protein-calorie malnutrition, severe 01/03/2019  . Palliative care by specialist   . Goals of care, counseling/discussion   . Respiratory failure ([place])   . Pressure injury of skin 12/30/2018  . ICH (intracerebral hemorrhage) ([place]) 01/03/2019  . Dementia ([place]) 05/03/2017  . Dermatitis 08/05/2015  . Chronic back pain 12/06/2013    Palliative Care Plan    Recommendations/Plan:  Family having a difficult time with accepting end of life.  They find solace in prayer that she will be healed and  live.  Patient appears very comfortable.  Continue comfort measures.  Don't believe oxygen n/c is of benefit to her (her mouth is open).  Consider removing it if it would make patient more comfortable.  Goals of Care and Additional Recommendations:  Limitations on Scope of Treatment: Full Comfort Care  Code Status:  DNR  Prognosis:   Hours - Days   Discharge Planning:  Anticipated Hospital Death  Care plan was discussed with RN, Sister.  Thank you for allowing the Palliative Medicine Team to assist in the care of this patient.  Total time spent:  25 min.     Greater than 50%  of this time was spent counseling and coordinating care related to the above assessment and plan.  Florentina Jenny, PA-C Palliative Medicine  Please contact Palliative MedicineTeam phone at [phone] for questions and concerns between 7 am - 7 pm.   Please see AMION for individual provider pager numbers.

## 2019-01-15 NOTE — Progress Notes (Signed)
Subjective: Patient reports unresponsive  Objective: Vital signs in last 24 hours: Temp:  [97 F (36.1 C)] 97 F (36.1 C) (12/26 1100) Pulse Rate:  [89-108] 89 (12/27 0533) Resp:  [14] 14 (12/26 1619) BP: (82-100)/(53-65) 82/53 (12/27 0533) SpO2:  [90 %-91 %] 90 % (12/27 0533)  Intake/Output from previous day: 12/26 0701 - 12/27 0700 In: 88.8 [I.V.:88.8] Out: 460 [Urine:460] Intake/Output this shift: No intake/output data recorded.  Physical Exam: Unresponsive, no eye opening or spontaneous movement.  Cranial dressing CDI.  IVC has been removed.  No leakage from stapled site.  Lab Results: No results for input(s): WBC, HGB, HCT, PLT in the last 72 hours. BMET No results for input(s): NA, K, CL, CO2, GLUCOSE, BUN, CREATININE, CALCIUM in the last 72 hours.  Studies/Results: No results found.  Assessment/Plan: Continue supportive care.  IVC has been removed.  NS service very much appreciates the help for patient and her family from [place].    LOS: 16 days    Peggyann Shoals, MD 01/15/2019, 9:51 AM

## 2019-01-15 NOTE — Plan of Care (Signed)
  Problem: Education: Goal: Knowledge of General Education information will improve Description: Including pain rating scale, medication(s)/side effects and non-pharmacologic comfort measures Outcome: Not Progressing   Problem: Health Behavior/Discharge Planning: Goal: Ability to manage health-related needs will improve Outcome: Not Progressing   Problem: Clinical Measurements: Goal: Ability to maintain clinical measurements within normal limits will improve Outcome: Not Progressing Goal: Will remain free from infection Outcome: Not Progressing Goal: Diagnostic test results will improve Outcome: Not Progressing Goal: Respiratory complications will improve Outcome: Not Progressing Goal: Cardiovascular complication will be avoided Outcome: Not Progressing   Problem: Activity: Goal: Risk for activity intolerance will decrease Outcome: Not Progressing   Problem: Nutrition: Goal: Adequate nutrition will be maintained Outcome: Not Progressing   Problem: Coping: Goal: Level of anxiety will decrease Outcome: Not Progressing   Problem: Elimination: Goal: Will not experience complications related to bowel motility Outcome: Not Progressing Goal: Will not experience complications related to urinary retention Outcome: Not Progressing   Problem: Pain Managment: Goal: General experience of comfort will improve Outcome: Not Progressing   Problem: Safety: Goal: Ability to remain free from injury will improve Outcome: Not Progressing   Problem: Skin Integrity: Goal: Risk for impaired skin integrity will decrease Outcome: Not Progressing   Problem: Education: Goal: Knowledge of the prescribed therapeutic regimen will improve Outcome: Not Progressing   Problem: Nutrition: Goal: Maintenance of adequate nutrition will improve Outcome: Not Progressing  Patient still experiencing "tics" approximately 30 minutes prior to ordered Ativan. Tics are not severe, and do not appear to cause  the patient any discomfort. Patient was well managed overnight with a significant increase in urinary output. Patient's BP is soft this AM. Problem: Clinical Measurements: Goal: Complications related to the disease process, condition or treatment will be avoided or minimized Outcome: Not Progressing   Problem: Skin Integrity: Goal: Demonstration of wound healing without infection will improve Outcome: Not Progressing   Problem: Activity: Goal: Ability to tolerate increased activity will improve Outcome: Not Progressing   Problem: Respiratory: Goal: Ability to maintain a clear airway and adequate ventilation will improve Outcome: Not Progressing   Problem: Role Relationship: Goal: Method of communication will improve Outcome: Not Progressing

## 2019-01-16 NOTE — Progress Notes (Signed)
Nutrition Brief Note  Chart reviewed. Pt now transitioning to comfort care.  No further nutrition interventions warranted at this time.  Please re-consult as needed.   Areya Lemmerman A. Yannet Rincon, RD, LDN, CDCES Registered Dietitian II Certified Diabetes Care and Education Specialist Pager: [phone] After hours Pager: [phone]  

## 2019-01-17 NOTE — Progress Notes (Signed)
Visited patient at bedside.  She appears very peaceful.  No jerking movements. No signs of distress.  I spoke with her daughter on the phone.  She asked if there was any hope she would survive.  I explained that she is very near death.  Her daughter was accepting and grateful for the care being provided to her mother.   She requested that I take a picture of her mother and text it to her.  I did as she requested.  Florentina Jenny, PA-C Palliative Medicine Office:  [phone]  No charge.

## 2019-01-17 NOTE — Progress Notes (Addendum)
  Palliative Medicine Inpatient Follow Up Note  This NP visited patient at the bedside as a follow up to last weeks compassionate extubation. No family at bedside. Patient appeared labored. Asked nursing staff to please give additional bolus of dilaudid. Appears closer to end of life, decreased capillary refill, decreased urine output. Prognosis hours to days and anticipate hospital death. No changes to end of life medications.    Palliative will continue to follow and offer support.   Time Spent: 15 Greater than 50% of the time was spent in counseling and coordination of care  Tacey Ruiz, NP [place] Team Team Cell Phone: [phone]  Laporshia Hogen, NP Palliative Medicine Team Pager [phone] (Please see [email or web address] for schedule) Team Phone [phone]   Please utilize secure chat with additional questions, if there is no response within 30 minutes please call the above phone number  Palliative Medicine Team providers are available by phone from 7am to 7pm daily and can be reached through the team cell phone.  Should this patient require assistance outside of these hours, please call the patient's attending physician.

## 2019-01-18 DIAGNOSIS — Z515 Encounter for palliative care: Secondary | ICD-10-CM

## 2019-01-18 NOTE — Progress Notes (Signed)
   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 12/12 left EVD placed and s/p craniotomy for right tumor debulking and hematoma evacuation. Patient remains on ventilator post-op and PCCM following. Abnormal CT chest consistent with left lower lobe pneumonia, receiving ceftriaxone. EEG without evidence of active seizures, consistent with encephalopathy. Prognosis poor with current exam and MRI findings. Palliative medicine consultation for goals of care.  Today's Discussion (01/18/2019): Reviewed chart. Spoke with miss Financial risk analyst, Water quality scientist. Per heather patient had received some boluses overnight for tachypnea though appears more comfortable now. Extremities are cool to the touch. Patient is in the active dying process. Called patients daughter, Essie Christine to provide a daily update. Endorsed to her that it is looking like we are getting closer to end of life. She thanked the team for the call. Vocalized that we would continue to be in contact with any additional updates.   Subjective Physical Assessment: Appears comfortable, not exemplifying any nonverbal indicators of distress.  Objective: Vital Signs Vitals:   01/17/19 0506 01/18/19 0538  BP: 106/62 (!) 73/48  Pulse: (!) 114 (!) 105  Resp: (!) 21 15  Temp: 98.2 F (36.8 C) 98.4 F (36.9 C)  SpO2: (!) 57% 94%   Gen: NAD HEENT: moist mucous membranes CV: Regular rate and rhythm, no murmurs rubs or gallops PULM: (+) rattle, even non-labored breaths ABD: soft/nontender/nondistended/active bowel sounds  EXT: No edema, cool to touch, decreased capillary refill time Neuro: Somnolent  Recommendations/Plan: Continue comfort measures Patient appears comfortable on  dilaudid gtt, bolus PRN Provide family emotional support  Time Spent: 15 minutes Greater than 50% of the time was spent in counseling and coordination of care  [place] Team Team Cell Phone: [phone] Please utilize secure chat with additional questions, if there is no response within 30 minutes please call the above phone number  Palliative Medicine Team providers are available by phone from 7am to 7pm daily and can be reached through the team cell phone.  Should this patient require assistance outside of these hours, please call the patient's attending physician.

## 2019-01-18 NOTE — Progress Notes (Signed)
Neurosurgery Service Progress Note  Subjective: No acute events overnight   Objective: Vitals:   01/15/19 1420 01/16/19 0437 01/17/19 0506 01/18/19 0538  BP: (!) 81/54 (!) 71/49 106/62 (!) 73/48  Pulse: 86 98 (!) 114 (!) 105  Resp:  17 (!) 21 15  Temp:   98.2 F (36.8 C) 98.4 F (36.9 C)  TempSrc:   Oral Axillary  SpO2: 99% 93% (!) 57% 94%  Weight:      Height:       Temp (24hrs), Avg:98.4 F (36.9 C), Min:98.4 F (36.9 C), Max:98.4 F (36.9 C)  CBC Latest Ref Rng & Units 01/11/2019 01/10/2019 01/09/2019  WBC 4.0 - 10.5 K/uL 15.5(H) 14.4(H) 15.4(H)  Hemoglobin 12.0 - 15.0 g/dL 7.7(L) 7.1(L) 7.2(L)  Hematocrit 36.0 - 46.0 % 24.3(L) 21.6(L) 22.3(L)  Platelets 150 - 400 K/uL 560(H) 502(H) 276   BMP Latest Ref Rng & Units 01/11/2019 01/10/2019 01/09/2019  Glucose 70 - 99 mg/dL 124(H) 124(H) 110(H)  BUN 8 - 23 mg/dL 17 12 10   Creatinine 0.44 - 1.00 mg/dL 0.58 0.35(L) 0.37(L)  BUN/Creat Ratio 12 - 28 - - -  Sodium 135 - 145 mmol/L 135 135 134(L)  Potassium 3.5 - 5.1 mmol/L 4.4 4.3 4.2  Chloride 98 - 111 mmol/L 101 98 101  CO2 22 - 32 mmol/L 23 25 24   Calcium 8.9 - 10.3 mg/dL 8.2(L) 8.3(L) 8.0(L)    Intake/Output Summary (Last 24 hours) at 01/18/2019 0851 Last data filed at 01/18/2019 0531 Gross per 24 hour  Intake 0 ml  Output 310 ml  Net -310 ml    Current Facility-Administered Medications:  .  [DISCONTINUED] acetaminophen (TYLENOL) tablet 650 mg, 650 mg, Oral, Q4H PRN **OR** acetaminophen (TYLENOL) suppository 650 mg, 650 mg, Rectal, Q4H PRN, Ryota Treece, Joyice Faster, MD .  Chlorhexidine Gluconate Cloth 2 % PADS 6 each, 6 each, Topical, Daily, Judith Part, MD, 6 each at 01/17/19 1151 .  glycopyrrolate (ROBINUL) tablet 1 mg, 1 mg, Oral, Q4H PRN **OR** glycopyrrolate (ROBINUL) injection 0.2 mg, 0.2 mg, Subcutaneous, Q4H PRN **OR** glycopyrrolate (ROBINUL) injection 0.2 mg, 0.2 mg, Intravenous, Q4H PRN, Lane Hacker L, DO, 0.2 mg at 01/15/19 2342 .   HYDROmorphone (DILAUDID) 50 mg in sodium chloride 0.9 % 100 mL (0.5 mg/mL) infusion, 1-2 mg/hr, Intravenous, Continuous, Dellinger, Bobby Rumpf, PA-C, Last Rate: 4 mL/hr at 01/17/19 2132, 2 mg/hr at 01/17/19 2132 .  HYDROmorphone (DILAUDID) bolus via infusion 1 mg, 1 mg, Intravenous, Q30 min PRN, Dellinger, Bobby Rumpf, PA-C, 1 mg at 01/18/19 [phone] .  LORazepam (ATIVAN) injection 1 mg, 1 mg, Intravenous, Q6H, Dellinger, Marianne L, PA-C, 1 mg at 01/18/19 0521 .  ondansetron (ZOFRAN) tablet 4 mg, 4 mg, Oral, Q4H PRN **OR** ondansetron (ZOFRAN) injection 4 mg, 4 mg, Intravenous, Q4H PRN, Izayah Miner, Joyice Faster, MD .  polyvinyl alcohol (LIQUIFILM TEARS) 1.4 % ophthalmic solution 1 drop, 1 drop, Both Eyes, QID PRN, Lane Hacker L, DO .  propofol (DIPRIVAN) 1000 MG/100ML infusion, 5-30 mcg/kg/min, Intravenous, Titrated, Acquanetta Chain, DO, Stopped at 01/13/19 1945   Physical Exam: Appears comfortable, shallow non-labored breaths ~14/min  Assessment & Plan: 63 y.o. 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 12/12 post-op MRI w/ expected residual tumor in brainstem, good debulking, scattered areas of diffusion changes including posterior pons likely 2/2 hemorrhage event, unclear pattern. 12/14  EEG w/ some focal spikes, no epileptiform activity, CT CAP with consolidation, no primary tumor, 12/15 rpt CTH w/ improved shift / pneumocephalus, ventriculomegaly, 12/16 rpt CTH slightly increased R subdural collection, EVD raised to +15, 12/24 s/p compassionate extubation & comfort care  -f/u pal care recs, pt appears comfortable at this time  Judith Part  01/18/19 8:51 AM

## 2019-01-19 NOTE — Progress Notes (Signed)
Chaplain visited with the patient and the family and prayed with them.  The sister is heading home and wanted prayer for her sister.  The chaplain will follow-up as necessary.  Brion Aliment Chaplain Resident For questions concerning this note please contact me by pager [phone]

## 2019-01-19 NOTE — Progress Notes (Signed)
Neurosurgery Service Progress Note  Subjective: No acute events overnight   Objective: Vitals:   01/17/19 0506 01/18/19 0538 01/19/19 0616 01/19/19 0757  BP: 106/62 (!) 73/48 (!) 85/65 (!) 69/51  Pulse: (!) 114 (!) 105 (!) 106 (!) 104  Resp: (!) 21 15 16    Temp: 98.2 F (36.8 C) 98.4 F (36.9 C) 98.2 F (36.8 C) 97.8 F (36.6 C)  TempSrc: Oral Axillary Oral Oral  SpO2: (!) 57% 94% (!) 83% (!) 84%  Weight:      Height:       Temp (24hrs), Avg:98 F (36.7 C), Min:97.8 F (36.6 C), Max:98.2 F (36.8 C)  CBC Latest Ref Rng & Units 01/11/2019 01/10/2019 01/09/2019  WBC 4.0 - 10.5 K/uL 15.5(H) 14.4(H) 15.4(H)  Hemoglobin 12.0 - 15.0 g/dL 7.7(L) 7.1(L) 7.2(L)  Hematocrit 36.0 - 46.0 % 24.3(L) 21.6(L) 22.3(L)  Platelets 150 - 400 K/uL 560(H) 502(H) 276   BMP Latest Ref Rng & Units 01/11/2019 01/10/2019 01/09/2019  Glucose 70 - 99 mg/dL 124(H) 124(H) 110(H)  BUN 8 - 23 mg/dL 17 12 10   Creatinine 0.44 - 1.00 mg/dL 0.58 0.35(L) 0.37(L)  BUN/Creat Ratio 12 - 28 - - -  Sodium 135 - 145 mmol/L 135 135 134(L)  Potassium 3.5 - 5.1 mmol/L 4.4 4.3 4.2  Chloride 98 - 111 mmol/L 101 98 101  CO2 22 - 32 mmol/L 23 25 24   Calcium 8.9 - 10.3 mg/dL 8.2(L) 8.3(L) 8.0(L)    Intake/Output Summary (Last 24 hours) at 01/19/2019 0901 Last data filed at 01/19/2019 [id number] Gross per 24 hour  Intake --  Output 200 ml  Net -200 ml    Current Facility-Administered Medications:  .  [DISCONTINUED] acetaminophen (TYLENOL) tablet 650 mg, 650 mg, Oral, Q4H PRN **OR** acetaminophen (TYLENOL) suppository 650 mg, 650 mg, Rectal, Q4H PRN, Conswella Bruney, Joyice Faster, MD .  Chlorhexidine Gluconate Cloth 2 % PADS 6 each, 6 each, Topical, Daily, Judith Part, MD, 6 each at 01/18/19 [phone] .  glycopyrrolate (ROBINUL) tablet 1 mg, 1 mg, Oral, Q4H PRN **OR** glycopyrrolate (ROBINUL) injection 0.2 mg, 0.2 mg, Subcutaneous, Q4H PRN, 0.2 mg at 01/18/19 1850 **OR** glycopyrrolate (ROBINUL) injection 0.2 mg, 0.2 mg,  Intravenous, Q4H PRN, Lane Hacker L, DO, 0.2 mg at 01/15/19 2342 .  HYDROmorphone (DILAUDID) 50 mg in sodium chloride 0.9 % 100 mL (0.5 mg/mL) infusion, 1-2 mg/hr, Intravenous, Continuous, Dellinger, Bobby Rumpf, PA-C, Last Rate: 4 mL/hr at 01/18/19 2100, 2 mg/hr at 01/18/19 2100 .  HYDROmorphone (DILAUDID) bolus via infusion 1 mg, 1 mg, Intravenous, Q30 min PRN, Dellinger, Marianne L, PA-C, 1 mg at 01/18/19 1455 .  LORazepam (ATIVAN) injection 1 mg, 1 mg, Intravenous, Q6H, Dellinger, Marianne L, PA-C, 1 mg at 01/19/19 0605 .  ondansetron (ZOFRAN) tablet 4 mg, 4 mg, Oral, Q4H PRN **OR** ondansetron (ZOFRAN) injection 4 mg, 4 mg, Intravenous, Q4H PRN, Breanah Faddis, Joyice Faster, MD .  polyvinyl alcohol (LIQUIFILM TEARS) 1.4 % ophthalmic solution 1 drop, 1 drop, Both Eyes, QID PRN, Lane Hacker L, DO .  propofol (DIPRIVAN) 1000 MG/100ML infusion, 5-30 mcg/kg/min, Intravenous, Titrated, Acquanetta Chain, DO, Stopped at 01/13/19 1945   Physical Exam: Appears comfortable, breathing more rapid and shallow today, breathing ~36/min   Assessment & Plan: 63 y.o. 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 12/12 post-op MRI w/ expected residual tumor  in brainstem, good debulking, scattered areas of diffusion changes including posterior pons likely 2/2 hemorrhage event, unclear pattern. 12/14 EEG w/ some focal spikes, no epileptiform activity, CT CAP with consolidation, no primary tumor, 12/15 rpt CTH w/ improved shift / pneumocephalus, ventriculomegaly, 12/16 rpt CTH slightly increased R subdural collection, EVD raised to +15, 12/24 s/p compassionate extubation & comfort care  -f/u pal care recs, pt appears comfortable at this time, today appears to be close to dying   Judith Part  01/19/19 9:01  AM

## 2019-01-19 NOTE — Progress Notes (Signed)
   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 12/12 left EVD placed and s/p craniotomy for right tumor debulking and hematoma evacuation. Patient remains on ventilator post-op and PCCM following. Abnormal CT chest consistent with left lower lobe pneumonia, receiving ceftriaxone. EEG without evidence of active seizures, consistent with encephalopathy. Prognosis poor with current exam and MRI findings. Palliative medicine consultation for goals of care.  Today's Discussion (01/18/2019): Reviewed chart. Spoke with miss Financial risk analyst. Patient appears comfortable this morning. Per nursing she has had a stable night. Appears to be closer to the end of life process at the present time given that her extremities remain to be more cool to the touch than previously. Will continue to follow and offer support to patient family daily.   Subjective Physical Assessment: Not exemplifying any nonverbal indicators of pain or dyspnea.  Objective: Vital Signs Vitals:   01/18/19 0538 01/19/19 0616  BP: (!) 73/48 (!) 85/65  Pulse: (!) 105 (!) 106  Resp: 15 16  Temp: 98.4 F (36.9 C) 98.2 F (36.8 C)  SpO2: 94% (!) 83%   Gen: NAD HEENT: moist mucous membranes CV: Regular rate and rhythm, no murmurs rubs or gallops PULM: (+) rattle, even non-labored breaths ABD: soft/nontender EXT: No edema, cool to touch, decreased capillary refill time Neuro: Somnolent, unarousable  Recommendations/Plan: Continue comfort measures Patient appears comfortable on dilaudid gtt at 2mg /hr Dilaudid bolus 1mg  PRN for comfort at end of life, air hunger, myoclonic jerking Provide family emotional support  Time Spent: 15 minutes Greater than 50%  of the time was spent in counseling and coordination of care  [facility] Team Team Cell Phone: [phone] Please utilize secure chat with additional questions, if there is no response within 30 minutes please call the above phone number  Palliative Medicine Team providers are available by phone from 7am to 7pm daily and can be reached through the team cell phone.  Should this patient require assistance outside of these hours, please call the patient's attending physician.

## 2019-01-20 DIAGNOSIS — E876 Hypokalemia: Secondary | ICD-10-CM | POA: Diagnosis not present

## 2019-01-20 DIAGNOSIS — J189 Pneumonia, unspecified organism: Secondary | ICD-10-CM | POA: Diagnosis not present

## 2019-01-20 DIAGNOSIS — G9349 Other encephalopathy: Secondary | ICD-10-CM | POA: Diagnosis not present

## 2019-01-20 DIAGNOSIS — L89896 Pressure-induced deep tissue damage of other site: Secondary | ICD-10-CM | POA: Diagnosis not present

## 2019-01-20 DIAGNOSIS — G8190 Hemiplegia, unspecified affecting unspecified side: Secondary | ICD-10-CM | POA: Diagnosis not present

## 2019-01-20 DIAGNOSIS — A419 Sepsis, unspecified organism: Secondary | ICD-10-CM | POA: Diagnosis not present

## 2019-01-20 DIAGNOSIS — Z20828 Contact with and (suspected) exposure to other viral communicable diseases: Secondary | ICD-10-CM | POA: Diagnosis not present

## 2019-01-20 DIAGNOSIS — Z66 Do not resuscitate: Secondary | ICD-10-CM | POA: Diagnosis not present

## 2019-01-20 DIAGNOSIS — I613 Nontraumatic intracerebral hemorrhage in brain stem: Secondary | ICD-10-CM | POA: Diagnosis not present

## 2019-01-20 DIAGNOSIS — G8194 Hemiplegia, unspecified affecting left nondominant side: Secondary | ICD-10-CM | POA: Diagnosis not present

## 2019-01-20 DIAGNOSIS — L89153 Pressure ulcer of sacral region, stage 3: Secondary | ICD-10-CM | POA: Diagnosis not present

## 2019-01-20 DIAGNOSIS — D638 Anemia in other chronic diseases classified elsewhere: Secondary | ICD-10-CM | POA: Diagnosis not present

## 2019-01-20 DIAGNOSIS — C710 Malignant neoplasm of cerebrum, except lobes and ventricles: Secondary | ICD-10-CM | POA: Diagnosis not present

## 2019-01-20 DIAGNOSIS — E43 Unspecified severe protein-calorie malnutrition: Secondary | ICD-10-CM | POA: Diagnosis not present

## 2019-01-20 DIAGNOSIS — R6521 Severe sepsis with septic shock: Secondary | ICD-10-CM | POA: Diagnosis not present

## 2019-01-20 DIAGNOSIS — H4911 Fourth [trochlear] nerve palsy, right eye: Secondary | ICD-10-CM | POA: Diagnosis not present

## 2019-01-20 DIAGNOSIS — G911 Obstructive hydrocephalus: Secondary | ICD-10-CM | POA: Diagnosis not present

## 2019-01-20 DIAGNOSIS — I619 Nontraumatic intracerebral hemorrhage, unspecified: Secondary | ICD-10-CM | POA: Diagnosis present

## 2019-01-20 DIAGNOSIS — G40909 Epilepsy, unspecified, not intractable, without status epilepticus: Secondary | ICD-10-CM | POA: Diagnosis not present

## 2019-01-20 DIAGNOSIS — Z681 Body mass index (BMI) 19 or less, adult: Secondary | ICD-10-CM | POA: Diagnosis not present

## 2019-01-20 DIAGNOSIS — J95821 Acute postprocedural respiratory failure: Secondary | ICD-10-CM | POA: Diagnosis not present

## 2019-01-20 DIAGNOSIS — F0390 Unspecified dementia without behavioral disturbance: Secondary | ICD-10-CM | POA: Diagnosis not present

## 2019-01-20 DIAGNOSIS — Z515 Encounter for palliative care: Secondary | ICD-10-CM | POA: Diagnosis not present

## 2019-01-20 DEATH — deceased

## 2019-02-08 LAB — SURGICAL PATHOLOGY

## 2019-02-10 ENCOUNTER — Encounter (HOSPITAL_COMMUNITY): Payer: Self-pay

## 2019-02-20 NOTE — Progress Notes (Addendum)
   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 12/12 left EVD placed and s/p craniotomy for right tumor debulking and hematoma evacuation. Patient remains on ventilator post-op and PCCM following. Abnormal CT chest consistent with left lower lobe pneumonia, receiving ceftriaxone. EEG without evidence of active seizures, consistent with encephalopathy. Prognosis poor with current exam and MRI findings. Palliative medicine consultation for goals of care.  Today's Discussion: Patient has had no significant events in the last 24 hours. Remains to look comfortable on dilaudid gtt. Spoke with patients bedside RN who did not highlight any concerns. Patients husband was able to be present with her until the new year over the evening hours.   Will continue to offer family emotional support.   Subjective Physical Assessment: Not exemplifying any nonverbal indicators of pain or dyspnea.  Objective: Vital Signs Vitals:   February 05, 2019 0402  BP: (!) 69/49  Pulse: (!) 115  Resp: (!) 32  Temp: 98.2 F (36.8 C)  SpO2: (!) 73%   Gen: NAD HEENT: moist mucous membranes CV: Regular rate and rhythm  PULM:  even non-labored breaths ABD: soft/nontender EXT: No edema  Neuro:  unarousable  Recommendations/Plan: Continue comfort measures Patient appears comfortable on dilaudid gtt at 2mg /hr Dilaudid bolus 1mg  PRN for comfort at end of life, air hunger, myoclonic jerking Provide family emotional support Appreciate Chaplain services meeting with family  Time Spent: 15 minutes Greater than 50% of the time was spent in counseling and coordination of care  [facility] Team Team Cell Phone:  [phone] Please utilize secure chat with additional questions, if there is no response within 30 minutes please call the above phone number  Palliative Medicine Team providers are available by phone from 7am to 7pm daily and can be reached through the team cell phone.  Should this patient require assistance outside of these hours, please call the patient's attending physician.

## 2019-02-20 NOTE — Progress Notes (Signed)
Patient expired time of death May 11, 2205. MD Cabbell on call paged and aware of patients death. Patients husband notified as well. Information received for funeral home arrangements and states to take patient to morgue. No family present at bedside.

## 2019-02-20 NOTE — Progress Notes (Signed)
Pt expired at 2207; wasted 97.42ml of morphine with Lasandra Beech.

## 2019-02-20 NOTE — Progress Notes (Signed)
Patient ID: Jasmine [name], female   DOB: [birth year], 64 y.o.   MRN: [id number]  BP (!) 69/49 (BP Location: Left Arm)   Pulse (!) 115   Temp 98.2 F (36.8 C) (Oral)   Resp (!) 32   Ht 5\' 6"  (1.676 m)   Wt 45.2 kg   SpO2 (!) 73%   BMI 16.08 kg/m   Agonal breathing, not responsive Comfort care

## 2019-02-20 DEATH — deceased

## 2019-04-21 ENCOUNTER — Other Ambulatory Visit: Payer: Self-pay | Admitting: Diagnostic Neuroimaging

## 2019-12-28 IMAGING — CT CT HEAD W/O CM
4 series · 16 of 47 positions shown, 18 images · non-contrast
Comparison: CT head 01/02/2019

CLINICAL DATA: Brain tumor.  Neuro change.

EXAM:
CT HEAD WITHOUT CONTRAST
TECHNIQUE: Contiguous axial images were obtained from the base of the skull
through the vertex without intravenous contrast.

[Series 2: head without · axial · non-contrast · 0.42mm/px · z∈[-95,+25]mm · 7 of 33 slices shown, 9 images]
[im 5/33  brain]
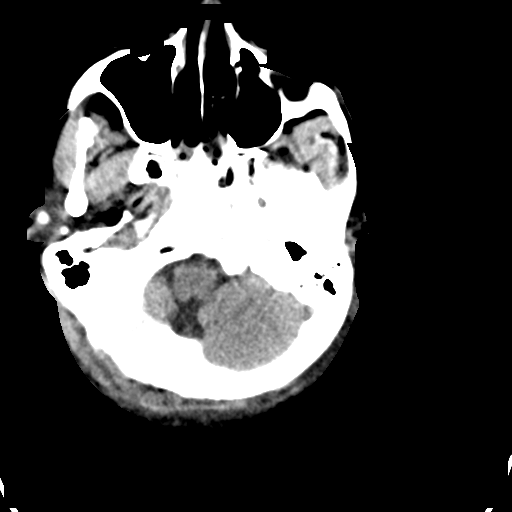
[im 5/33  bone]
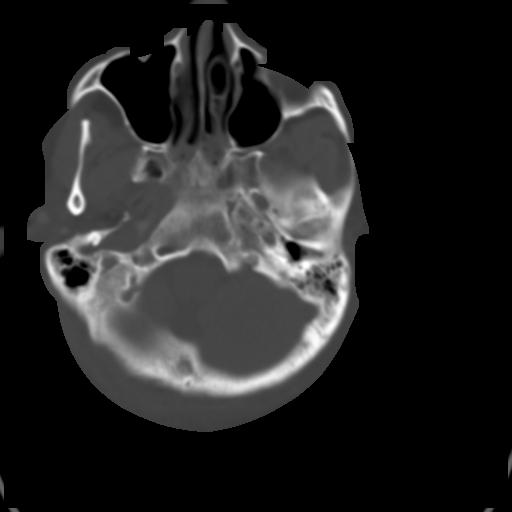
[im 9/33  brain]
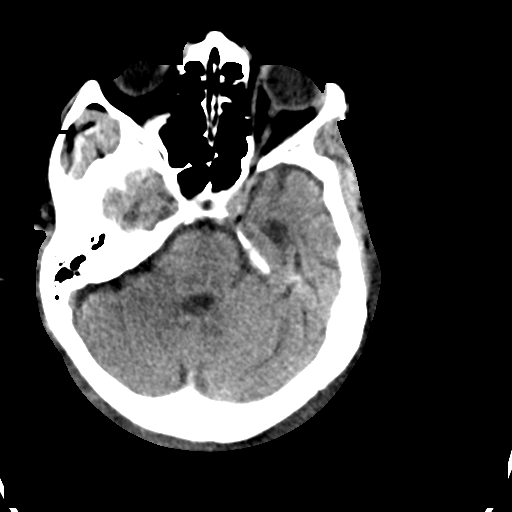
[im 13/33  brain]
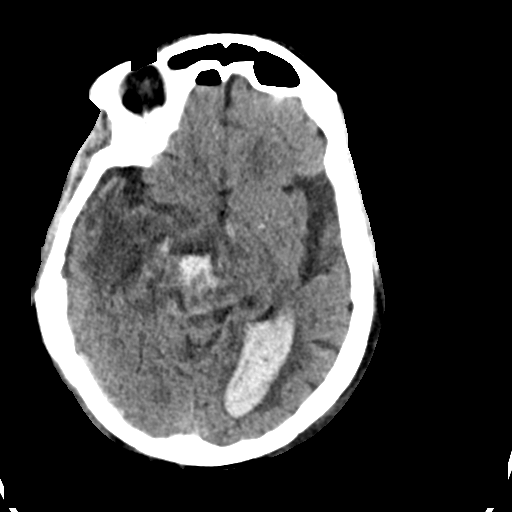
[im 17/33  brain]
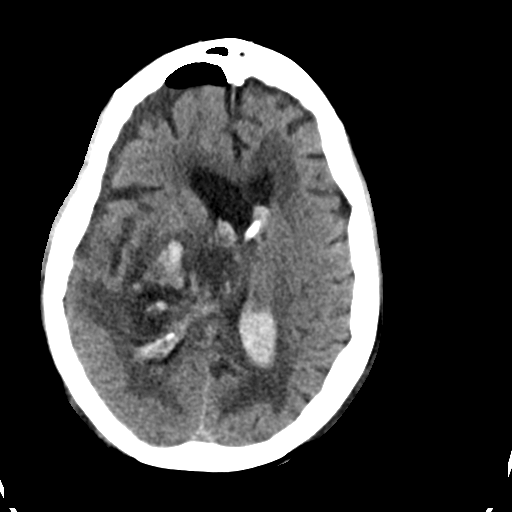
[im 21/33  brain]
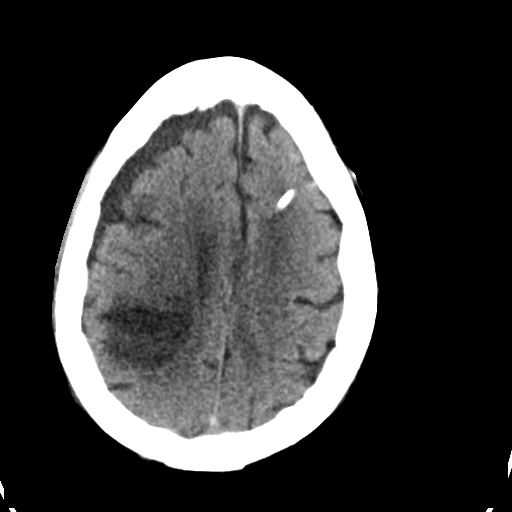
[im 21/33  bone]
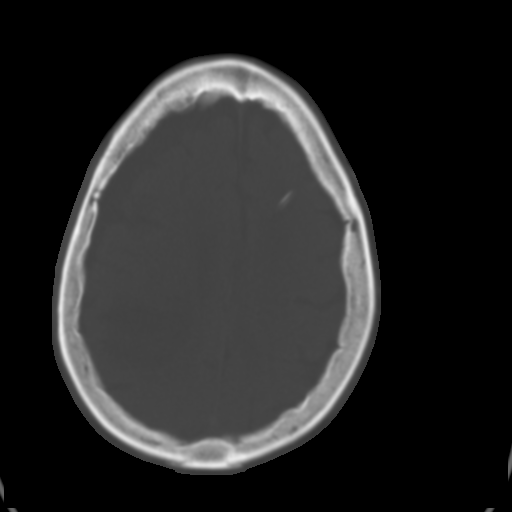
[im 25/33  brain]
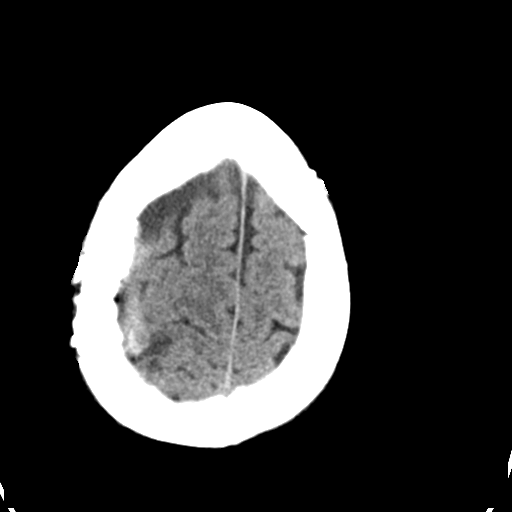
[im 29/33  brain]
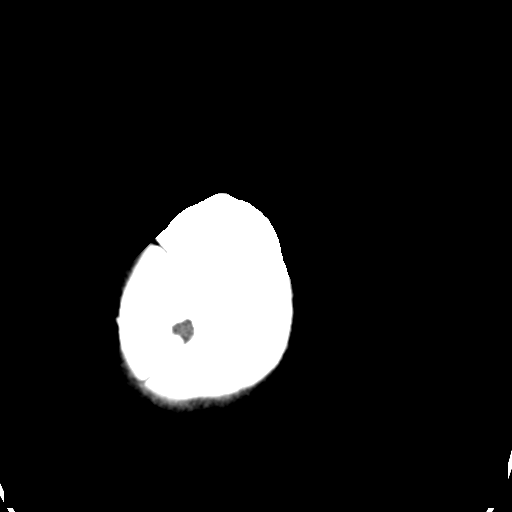

[Series 3: head bone · axial · 0.42mm/px · z∈[-99,-67]mm · 3 of 82 slices shown]
[im 9/82  bone]
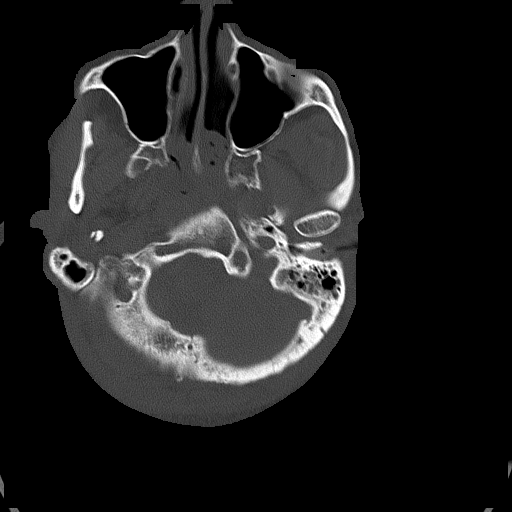
[im 17/82  bone]
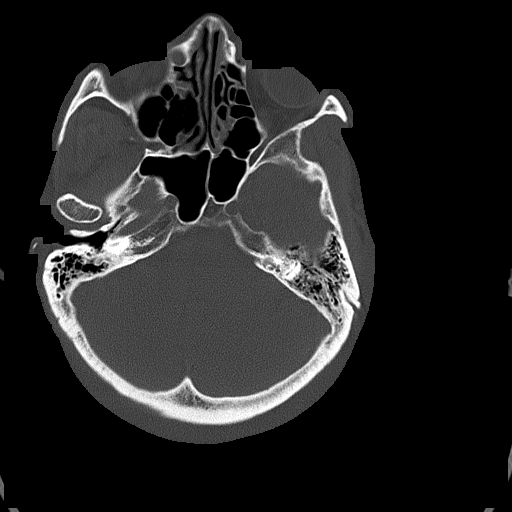
[im 25/82  bone]
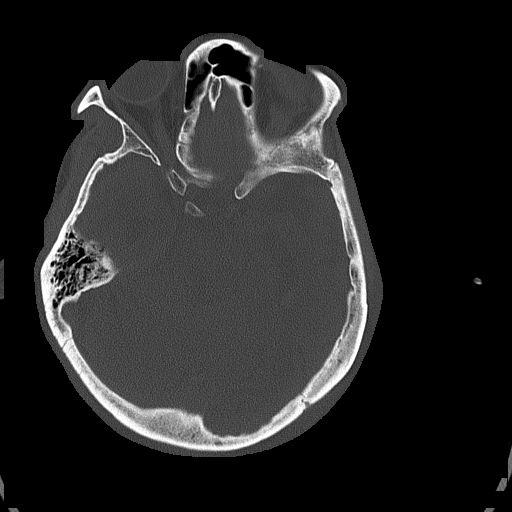

[Series 4: head without cor · coronal · non-contrast · 0.37mm/px · 3 of 67 slices shown]
[im 23/67  brain]
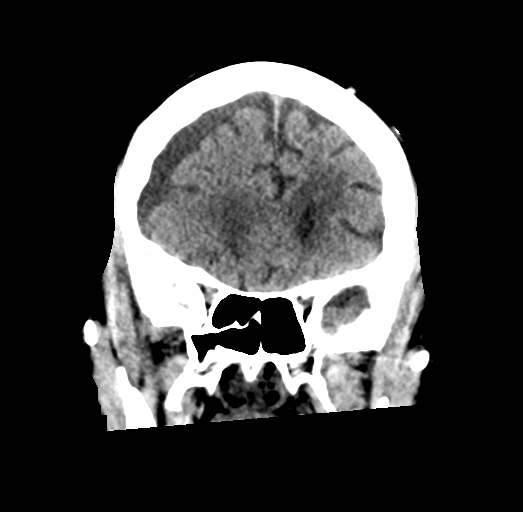
[im 30/67  brain]
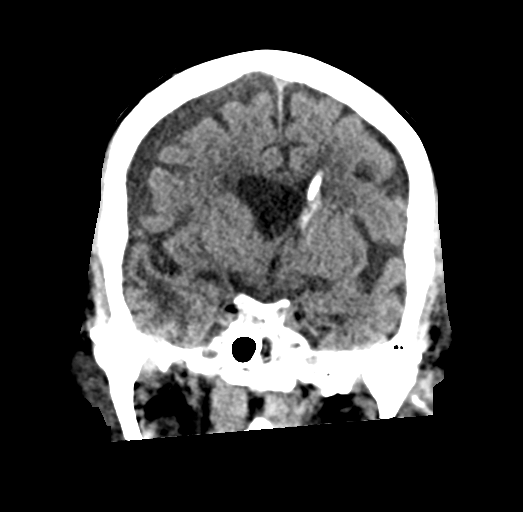
[im 37/67  brain]
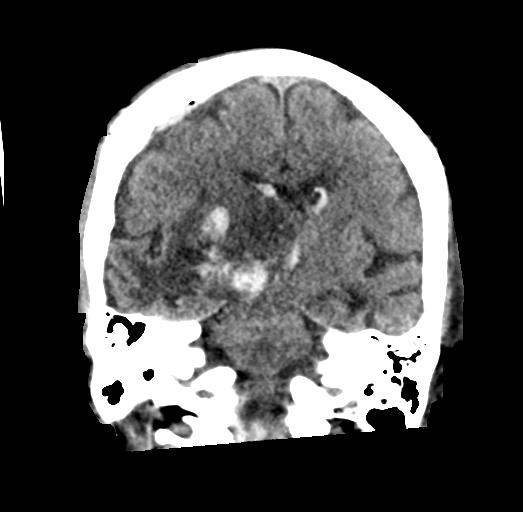

[Series 5: head without sag · sagittal · non-contrast · 0.36mm/px · 3 of 67 slices shown]
[im 23/67  brain]
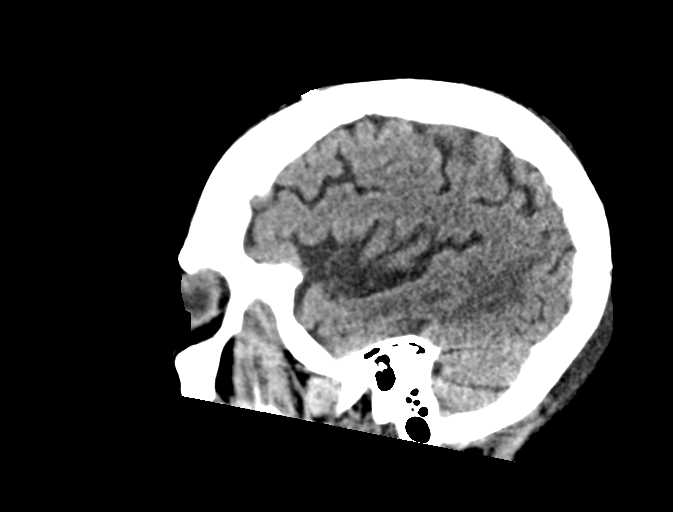
[im 34/67  brain]
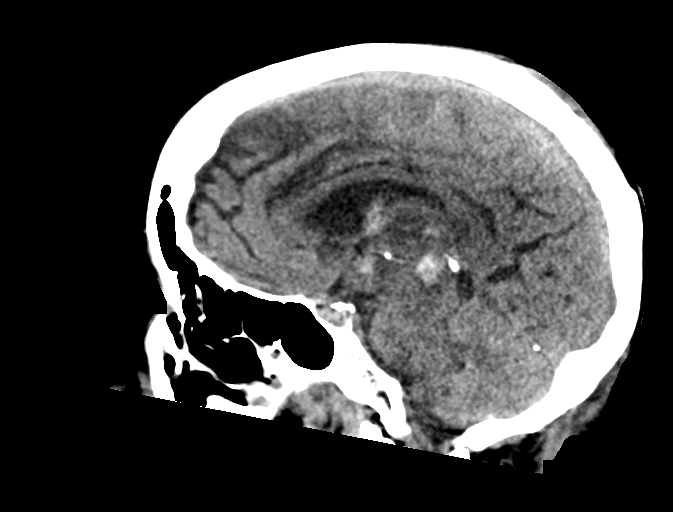
[im 45/67  brain]
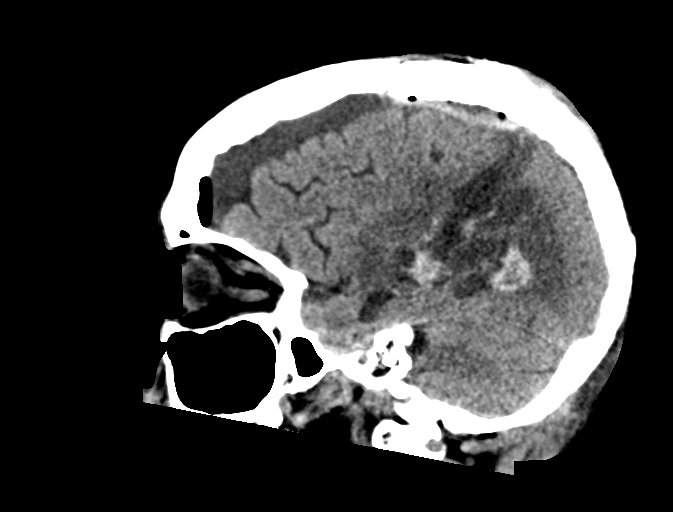

[16 of 47 positions shown; findings below may reference images not displayed]

FINDINGS: Brain: Left frontal ventricular catheter extends into the third
ventricle unchanged. Decreased ventricular dilatation. Moderate
ventricular hemorrhage is stable.

Hemorrhagic mass in the right thalamus and surrounding tissues is
similar to the prior study. There is edema and mass-effect on the
midbrain unchanged. 10 mm midline shift to the left unchanged.

Atrophy and chronic microvascular ischemia.

Subdural gas has improved. Subdural low-density fluid is slightly
increased. Small amount of high-density subdural hemorrhage at the
craniotomy site is unchanged.

Vascular: Negative for hyperdense vessel

Skull: Right parietal craniotomy.

Sinuses/Orbits: Mild mucosal edema in the paranasal sinuses.
Negative orbit

Other: None
IMPRESSION: Right frontal subdural low-density collection slightly larger.
Decreased subdural gas on the right.

Hemorrhagic mass centered at the right thalamus is unchanged. There
is mass-effect on the midbrain with 10 mm midline shift unchanged.
Decreased ventricular dilatation compared with 2 days ago.
Intraventricular hemorrhage stable.
